# Patient Record
Sex: Female | Born: 1950
Health system: Southern US, Community
[De-identification: ages and names within clinical notes are randomized; demographics above are authoritative.]

## PROBLEM LIST (undated history)

## (undated) DIAGNOSIS — G629 Polyneuropathy, unspecified: Secondary | ICD-10-CM

## (undated) DIAGNOSIS — K219 Gastro-esophageal reflux disease without esophagitis: Secondary | ICD-10-CM

## (undated) DIAGNOSIS — I10 Essential (primary) hypertension: Secondary | ICD-10-CM

## (undated) DIAGNOSIS — M542 Cervicalgia: Secondary | ICD-10-CM

## (undated) DIAGNOSIS — E785 Hyperlipidemia, unspecified: Secondary | ICD-10-CM

## (undated) DIAGNOSIS — M1990 Unspecified osteoarthritis, unspecified site: Secondary | ICD-10-CM

## (undated) DIAGNOSIS — I341 Nonrheumatic mitral (valve) prolapse: Secondary | ICD-10-CM

## (undated) DIAGNOSIS — IMO0002 Reserved for concepts with insufficient information to code with codable children: Secondary | ICD-10-CM

## (undated) DIAGNOSIS — E041 Nontoxic single thyroid nodule: Secondary | ICD-10-CM

## (undated) HISTORY — PX: ABDOMINAL HYSTERECTOMY: SHX81

## (undated) HISTORY — PX: HAND SURGERY: SHX662

## (undated) HISTORY — PX: OTHER SURGICAL HISTORY: SHX169

---

## 1988-03-05 HISTORY — PX: LAPAROSCOPIC TOTAL HYSTERECTOMY: SUR800

## 2002-06-23 ENCOUNTER — Encounter: Payer: Self-pay | Admitting: Internal Medicine

## 2002-06-23 ENCOUNTER — Ambulatory Visit (HOSPITAL_COMMUNITY): Admission: RE | Admit: 2002-06-23 | Discharge: 2002-06-23 | Payer: Self-pay | Admitting: Internal Medicine

## 2007-08-21 ENCOUNTER — Ambulatory Visit (HOSPITAL_COMMUNITY): Payer: Self-pay | Admitting: Psychiatry

## 2007-09-04 ENCOUNTER — Ambulatory Visit (HOSPITAL_COMMUNITY): Payer: Self-pay | Admitting: Psychiatry

## 2007-09-25 ENCOUNTER — Ambulatory Visit (HOSPITAL_COMMUNITY): Payer: Self-pay | Admitting: Psychiatry

## 2007-10-08 ENCOUNTER — Ambulatory Visit (HOSPITAL_COMMUNITY): Payer: Self-pay | Admitting: Psychiatry

## 2007-10-29 ENCOUNTER — Ambulatory Visit (HOSPITAL_COMMUNITY): Payer: Self-pay | Admitting: Psychiatry

## 2007-11-05 ENCOUNTER — Ambulatory Visit (HOSPITAL_COMMUNITY): Payer: Self-pay | Admitting: Psychiatry

## 2007-11-19 ENCOUNTER — Ambulatory Visit (HOSPITAL_COMMUNITY): Payer: Self-pay | Admitting: Psychiatry

## 2007-12-11 ENCOUNTER — Ambulatory Visit (HOSPITAL_COMMUNITY): Payer: Self-pay | Admitting: Psychiatry

## 2007-12-30 ENCOUNTER — Ambulatory Visit (HOSPITAL_COMMUNITY): Payer: Self-pay | Admitting: Psychiatry

## 2008-01-20 ENCOUNTER — Ambulatory Visit (HOSPITAL_COMMUNITY): Payer: Self-pay | Admitting: Psychiatry

## 2008-02-12 ENCOUNTER — Ambulatory Visit (HOSPITAL_COMMUNITY): Payer: Self-pay | Admitting: Psychiatry

## 2008-03-02 ENCOUNTER — Ambulatory Visit (HOSPITAL_COMMUNITY): Payer: Self-pay | Admitting: Psychiatry

## 2008-05-25 ENCOUNTER — Ambulatory Visit (HOSPITAL_COMMUNITY): Payer: Self-pay | Admitting: Psychiatry

## 2011-05-30 ENCOUNTER — Encounter (INDEPENDENT_AMBULATORY_CARE_PROVIDER_SITE_OTHER): Payer: Self-pay | Admitting: *Deleted

## 2011-06-21 ENCOUNTER — Other Ambulatory Visit (INDEPENDENT_AMBULATORY_CARE_PROVIDER_SITE_OTHER): Payer: Self-pay | Admitting: *Deleted

## 2011-06-21 ENCOUNTER — Encounter (INDEPENDENT_AMBULATORY_CARE_PROVIDER_SITE_OTHER): Payer: Self-pay | Admitting: *Deleted

## 2011-06-21 ENCOUNTER — Telehealth (INDEPENDENT_AMBULATORY_CARE_PROVIDER_SITE_OTHER): Payer: Self-pay | Admitting: *Deleted

## 2011-06-21 DIAGNOSIS — Z86010 Personal history of colonic polyps: Secondary | ICD-10-CM

## 2011-06-21 NOTE — Telephone Encounter (Signed)
Patient needs movi prep 

## 2011-06-22 MED ORDER — PEG-KCL-NACL-NASULF-NA ASC-C 100 G PO SOLR: 1.0000 | Freq: Once | ORAL | Status: DC

## 2011-07-24 ENCOUNTER — Emergency Department (HOSPITAL_COMMUNITY)
Admission: EM | Admit: 2011-07-24 | Discharge: 2011-07-24 | Discharge status: Against Medical Advice | Payer: Medicare Other | Attending: Emergency Medicine | Admitting: Emergency Medicine

## 2011-07-24 ENCOUNTER — Encounter (HOSPITAL_COMMUNITY): Payer: Self-pay | Admitting: *Deleted

## 2011-07-24 ENCOUNTER — Emergency Department (HOSPITAL_COMMUNITY): Payer: Medicare Other

## 2011-07-24 DIAGNOSIS — R0602 Shortness of breath: Secondary | ICD-10-CM | POA: Insufficient documentation

## 2011-07-24 DIAGNOSIS — R079 Chest pain, unspecified: Secondary | ICD-10-CM | POA: Insufficient documentation

## 2011-07-24 DIAGNOSIS — IMO0002 Reserved for concepts with insufficient information to code with codable children: Secondary | ICD-10-CM | POA: Insufficient documentation

## 2011-07-24 DIAGNOSIS — Z87891 Personal history of nicotine dependence: Secondary | ICD-10-CM | POA: Insufficient documentation

## 2011-07-24 DIAGNOSIS — E119 Type 2 diabetes mellitus without complications: Secondary | ICD-10-CM | POA: Insufficient documentation

## 2011-07-24 DIAGNOSIS — I10 Essential (primary) hypertension: Secondary | ICD-10-CM | POA: Insufficient documentation

## 2011-07-24 HISTORY — DX: Essential (primary) hypertension: I10

## 2011-07-24 HISTORY — DX: Reserved for concepts with insufficient information to code with codable children: IMO0002

## 2011-07-24 LAB — DIFFERENTIAL
Basophils Absolute: 0 10*3/uL (ref 0.0–0.1)
Eosinophils Absolute: 0.1 10*3/uL (ref 0.0–0.7)
Lymphs Abs: 2.6 10*3/uL (ref 0.7–4.0)
Monocytes Absolute: 0.5 10*3/uL (ref 0.1–1.0)
Neutrophils Relative %: 61 % (ref 43–77)

## 2011-07-24 LAB — POCT I-STAT, CHEM 8
Calcium, Ion: 1.25 mmol/L (ref 1.12–1.32)
HCT: 44 % (ref 36.0–46.0)
Hemoglobin: 15 g/dL (ref 12.0–15.0)
Sodium: 143 mEq/L (ref 135–145)
TCO2: 28 mmol/L (ref 0–100)

## 2011-07-24 LAB — POCT I-STAT TROPONIN I: Troponin i, poc: 0.01 ng/mL (ref 0.00–0.08)

## 2011-07-24 LAB — CBC
MCH: 27.9 pg (ref 26.0–34.0)
MCV: 82.3 fL (ref 78.0–100.0)
Platelets: 274 10*3/uL (ref 150–400)
RDW: 14.6 % (ref 11.5–15.5)

## 2011-07-24 MED ORDER — ASPIRIN 325 MG PO TABS
325.0000 mg | ORAL_TABLET | ORAL | Status: AC
  Administered 2011-07-24: 325 mg via ORAL
  Filled 2011-07-24: qty 1

## 2011-07-24 MED ORDER — NITROGLYCERIN 0.4 MG SL SUBL
0.4000 mg | SUBLINGUAL_TABLET | SUBLINGUAL | Status: DC | PRN
  Administered 2011-07-24 (×2): 0.4 mg via SUBLINGUAL
  Filled 2011-07-24: qty 25

## 2011-07-24 MED ORDER — NITROGLYCERIN 0.3 MG SL SUBL: 0.3000 mg | SUBLINGUAL_TABLET | SUBLINGUAL | Status: DC | PRN

## 2011-07-24 MED ORDER — NITROGLYCERIN 0.4 MG SL SUBL: 0.4000 mg | SUBLINGUAL_TABLET | SUBLINGUAL | Status: DC | PRN

## 2011-07-24 NOTE — Discharge Instructions (Signed)
Chest Pain (Nonspecific) It is often hard to give a specific diagnosis for the cause of chest pain. There is always a chance that your pain could be related to something serious, such as a heart attack or a blood clot in the lungs. You need to follow up with your caregiver for further evaluation. CAUSES   Heartburn.   Pneumonia or bronchitis.   Anxiety or stress.   Inflammation around your heart (pericarditis) or lung (pleuritis or pleurisy).   A blood clot in the lung.   A collapsed lung (pneumothorax). It can develop suddenly on its own (spontaneous pneumothorax) or from injury (trauma) to the chest.   Shingles infection (herpes zoster virus).  The chest wall is composed of bones, muscles, and cartilage. Any of these can be the source of the pain.  The bones can be bruised by injury.   The muscles or cartilage can be strained by coughing or overwork.   The cartilage can be affected by inflammation and become sore (costochondritis).  DIAGNOSIS  Lab tests or other studies, such as X-rays, electrocardiography, stress testing, or cardiac imaging, may be needed to find the cause of your pain.  TREATMENT   Treatment depends on what may be causing your chest pain. Treatment may include:   Acid blockers for heartburn.   Anti-inflammatory medicine.   Pain medicine for inflammatory conditions.   Antibiotics if an infection is present.   You may be advised to change lifestyle habits. This includes stopping smoking and avoiding alcohol, caffeine, and chocolate.   You may be advised to keep your head raised (elevated) when sleeping. This reduces the chance of acid going backward from your stomach into your esophagus.   Most of the time, nonspecific chest pain will improve within 2 to 3 days with rest and mild pain medicine.  HOME CARE INSTRUCTIONS   If antibiotics were prescribed, take your antibiotics as directed. Finish them even if you start to feel better.   For the next few  days, avoid physical activities that bring on chest pain. Continue physical activities as directed.   Do not smoke.   Avoid drinking alcohol.   Only take over-the-counter or prescription medicine for pain, discomfort, or fever as directed by your caregiver.   Follow your caregiver's suggestions for further testing if your chest pain does not go away.   Keep any follow-up appointments you made. If you do not go to an appointment, you could develop lasting (chronic) problems with pain. If there is any problem keeping an appointment, you must call to reschedule.  SEEK MEDICAL CARE IF:   You think you are having problems from the medicine you are taking. Read your medicine instructions carefully.   Your chest pain does not go away, even after treatment.   You develop a rash with blisters on your chest.  SEEK IMMEDIATE MEDICAL CARE IF:   You have increased chest pain or pain that spreads to your arm, neck, jaw, back, or abdomen.   You develop shortness of breath, an increasing cough, or you are coughing up blood.   You have severe back or abdominal pain, feel nauseous, or vomit.   You develop severe weakness, fainting, or chills.   You have a fever.  THIS IS AN EMERGENCY. Do not wait to see if the pain will go away. Get medical help at once. Call your local emergency services (911 in [place] Do not drive yourself to the hospital. MAKE SURE YOU:   Understand these instructions.     Will watch your condition.   Will get help right away if you are not doing well or get worse.  Document Released: 11/29/2004 Document Revised: 02/08/2011 Document Reviewed: 09/25/2007 [employer] Patient Information 2012 [employer]. 

## 2011-07-24 NOTE — ED Notes (Signed)
Pt left care area prior to receiving discharge instructions.

## 2011-07-24 NOTE — ED Notes (Signed)
Pt is here with left neck and radiated down to left chest and it started when they were trying to find out how to get in here with an appointment for her husband

## 2011-07-24 NOTE — ED Provider Notes (Signed)
History     CSN: [id number]  Arrival date & time 07/24/11  1429   First MD Initiated Contact with Patient 07/24/11 1551      Chief Complaint  Patient presents with  . Chest Pain    (Consider location/radiation/quality/duration/timing/severity/associated sxs/prior treatment) Patient is a 61 y.o. female presenting with chest pain. The history is provided by the patient.  Chest Pain The chest pain began 1 - 2 hours ago. Chest pain occurs constantly. The chest pain is unchanged. The pain is associated with breathing. At its most intense, the pain is at 7/10. The pain is currently at 7/10. The severity of the pain is moderate. The quality of the pain is described as squeezing. The pain radiates to the left neck and right neck. Chest pain is worsened by deep breathing. Primary symptoms include shortness of breath (mild). Pertinent negatives for primary symptoms include no fever, no fatigue, no syncope, no cough, no palpitations, no abdominal pain, no nausea, no vomiting and no dizziness.     Past Medical History  Diagnosis Date  . Diabetes mellitus   . Degenerative disk disease   . Hypertension     Past Surgical History  Procedure Date  . Abdominal hysterectomy     No family history on file.  History  Substance Use Topics  . Smoking status: Former Games developer  . Smokeless tobacco: Not on file  . Alcohol Use: No    OB History    Grav Para Term Preterm Abortions TAB SAB Ect Mult Living                  Review of Systems  Constitutional: Negative for fever and fatigue.  HENT: Negative for congestion, drooling and neck pain.   Eyes: Negative for pain.  Respiratory: Positive for shortness of breath (mild). Negative for cough.   Cardiovascular: Positive for chest pain. Negative for palpitations and syncope.  Gastrointestinal: Negative for nausea, vomiting, abdominal pain and diarrhea.  Genitourinary: Negative for dysuria and hematuria.  Musculoskeletal: Negative for back pain  and gait problem.  Skin: Negative for color change.  Neurological: Negative for dizziness and headaches.  Hematological: Negative for adenopathy.  Psychiatric/Behavioral: Negative for behavioral problems.  All other systems reviewed and are negative.    Allergies  Review of patient's allergies indicates no known allergies.  Home Medications   Current Outpatient Rx  Name Route Sig Dispense Refill  . VITAMIN C 1000 MG PO TABS Oral Take 1,000 mg by mouth daily.    Marland Kitchen DICLOFENAC SODIUM 75 MG PO TBEC Oral Take 75 mg by mouth 2 (two) times daily.    Marland Kitchen GABAPENTIN 300 MG PO CAPS Oral Take 600 mg by mouth 2 (two) times daily.    Marland Kitchen GLUCOSAMINE-CHONDROITIN 500-400 MG PO TABS Oral Take 2 tablets by mouth daily.    . GLYBURIDE-METFORMIN 5-500 MG PO TABS Oral Take 2 tablets by mouth daily with breakfast.    . GLYBURIDE-METFORMIN 5-500 MG PO TABS Oral Take 1 tablet by mouth at bedtime.    Marland Kitchen LEVEMIR FLEXPEN [place] Subcutaneous Inject 14 Units into the skin at bedtime.    Marland Kitchen MISOPROSTOL 200 MCG PO TABS Oral Take 200 mcg by mouth 2 (two) times daily. Take with diclofenac    . FISH OIL 1200 MG PO CAPS Oral Take 1 capsule by mouth daily.    Marland Kitchen OVER THE COUNTER MEDICATION Oral Take 1-2 tablets by mouth daily. Unknown OTC med- "nitrous oxide something" to clear the arteries    .  RAMIPRIL 5 MG PO CAPS Oral Take 5 mg by mouth daily.    Marland Kitchen PEG-KCL-NACL-NASULF-NA ASC-C 100 G PO SOLR Oral Take 1 kit (100 g total) by mouth once. 1 kit 0    BP 166/95  Pulse 71  Temp(Src) 97.9 F (36.6 C) (Oral)  Resp 14  SpO2 100%  Physical Exam  Constitutional: She is oriented to person, place, and time. She appears well-developed and well-nourished.  HENT:  Head: Normocephalic.  Mouth/Throat: No oropharyngeal exudate.  Eyes: Conjunctivae and EOM are normal. Pupils are equal, round, and reactive to light.  Neck: Normal range of motion. Neck supple.       No bruits heard on auscultation.  Cardiovascular: Normal rate, regular  rhythm, normal heart sounds and intact distal pulses.  Exam reveals no gallop and no friction rub.   No murmur heard. Pulmonary/Chest: Effort normal and breath sounds normal. No respiratory distress. She has no wheezes.  Abdominal: Soft. Bowel sounds are normal. There is no tenderness.  Musculoskeletal: Normal range of motion. She exhibits no edema and no tenderness.  Neurological: She is alert and oriented to person, place, and time.  Skin: Skin is warm and dry.  Psychiatric: She has a normal mood and affect. Her behavior is normal.    ED Course  Procedures (including critical care time)   Labs Reviewed  CBC  DIFFERENTIAL  POCT I-STAT, CHEM 8  POCT I-STAT TROPONIN I   Dg Chest 2 View  07/24/2011  *RADIOLOGY REPORT*  Clinical Data: Chest pain  CHEST - 2 VIEW  Comparison: None.  Findings: Normal heart size.  Clear lungs.  Degenerative changes in the thoracic spine with dextroscoliosis of the lumbar spine. No vertebral compression deformity.  Bronchitic changes have a chronic appearance.  No pneumothorax and no pleural effusion.  IMPRESSION: No active cardiopulmonary disease.  Original Report Authenticated By: Donavan Burnet, M.D.     No diagnosis found.   Date: 07/24/2011  Rate: 71  Rhythm: normal sinus rhythm  QRS Axis: normal  Intervals: normal  ST/T Wave abnormalities: T wave inversions in V1, V2, aVR  Conduction Disutrbances:none  Narrative Interpretation: Inverted T waves in V1, V2, aVR concerning for ischemia  Old EKG Reviewed: none available    MDM  3:59 PM 61 y.o. female w hx of DM, HTN, HLP pw central cp radiating to neck that began while sitting in a car at approx 2 pm today. CP has persisted, now 7/10. Pt notes pain cw pharmacologic stress test done at [facility] approx 5 years ago. Pt AFVSS here, lab work thus far non-contributory. Wells negative. Ecg concerning for ischemia.    5:09 PM: Pt continues to appear well on exam. She denies chest pain, but is anxious to  leave to be with her husband who is meeting with a consultant here at the hospital for his own medical issues. I notified her that the Attending physician and I both recommend that she stay and get admitted to the hospital to finish her workup and r/o NSTEMI. She would like to leave AMA. She is of sound mind and has normal decision making capability.  I have discussed the diagnosis/risks/treatment options with the patient and will recommend follow-up with pcp tomorrow to discuss stress testing since she is not willing to stay and finish her workup. We also discussed returning to the ED immediately if new or worsening sx occur. We discussed the sx which are most concerning (e.g., worsening or continued cp or sob, palpitations, diaphoresis) that necessitate  immediate return. Any new prescriptions provided to the patient are listed below.  New Prescriptions   NITROGLYCERIN (NITROSTAT) 0.4 MG SL TABLET    Place 1 tablet (0.4 mg total) under the tongue every 5 (five) minutes as needed for chest pain.    Clinical Impression 1. Chest pain        Purvis Sheffield, MD 07/24/11 2011

## 2011-07-27 NOTE — ED Provider Notes (Signed)
I saw and evaluated the patient, reviewed the resident's note and I agree with the findings and plan.     Nelia Shi, MD 07/27/11 1038

## 2011-08-14 ENCOUNTER — Telehealth (INDEPENDENT_AMBULATORY_CARE_PROVIDER_SITE_OTHER): Payer: Self-pay | Admitting: *Deleted

## 2011-08-14 NOTE — Telephone Encounter (Addendum)
PCP/Requesting MD: vyas   Name & DOB: Michelle Case [birth year]     Procedure: tcs/egd  Reason/Indication:  Hx polyps, acid reflux  Has patient had this procedure before?  yes  If so, when, by whom and where?  3 yrs ago ([facility])  Is there a family history of colon cancer?  no  Who?  What age when diagnosed?    Is patient diabetic?   yes      Does patient have prosthetic heart valve?  no  Do you have a pacemaker?  no  Has patient had joint replacement within last 12 months?  no  Is patient on Coumadin, Plavix and/or Aspirin? no  Medications: see EPIC  Allergies: nkda  Medication Adjustment: 1/2 glyburide/metformin day before -- per Terri  Procedure date & time: 09/05/11 at 730

## 2011-08-17 NOTE — Telephone Encounter (Signed)
agree

## 2011-08-23 ENCOUNTER — Encounter (INDEPENDENT_AMBULATORY_CARE_PROVIDER_SITE_OTHER): Payer: Self-pay

## 2011-08-27 ENCOUNTER — Encounter (HOSPITAL_COMMUNITY): Payer: Self-pay | Admitting: Pharmacy Technician

## 2011-08-27 ENCOUNTER — Other Ambulatory Visit (INDEPENDENT_AMBULATORY_CARE_PROVIDER_SITE_OTHER): Payer: Self-pay | Admitting: *Deleted

## 2011-08-27 DIAGNOSIS — K219 Gastro-esophageal reflux disease without esophagitis: Secondary | ICD-10-CM

## 2011-08-27 DIAGNOSIS — Z86010 Personal history of colonic polyps: Secondary | ICD-10-CM

## 2011-09-05 ENCOUNTER — Ambulatory Visit (HOSPITAL_COMMUNITY): Admission: RE | Admit: 2011-09-05 | Payer: Medicare Other | Source: Ambulatory Visit | Admitting: Internal Medicine

## 2011-09-05 ENCOUNTER — Encounter (HOSPITAL_COMMUNITY)
Admission: RE | Disposition: A | Discharge status: Home / Self Care | Payer: Self-pay | Source: Ambulatory Visit | Attending: Internal Medicine

## 2011-09-05 ENCOUNTER — Ambulatory Visit (HOSPITAL_COMMUNITY)
Admission: RE | Admit: 2011-09-05 | Discharge: 2011-09-05 | Disposition: A | Discharge status: Home / Self Care | Payer: Medicare Other | Source: Ambulatory Visit | Attending: Internal Medicine | Admitting: Internal Medicine

## 2011-09-05 ENCOUNTER — Encounter (HOSPITAL_COMMUNITY): Payer: Self-pay | Admitting: *Deleted

## 2011-09-05 ENCOUNTER — Encounter (HOSPITAL_COMMUNITY): Admission: RE | Payer: Self-pay | Source: Ambulatory Visit

## 2011-09-05 DIAGNOSIS — K319 Disease of stomach and duodenum, unspecified: Secondary | ICD-10-CM | POA: Insufficient documentation

## 2011-09-05 DIAGNOSIS — K219 Gastro-esophageal reflux disease without esophagitis: Secondary | ICD-10-CM

## 2011-09-05 DIAGNOSIS — K5730 Diverticulosis of large intestine without perforation or abscess without bleeding: Secondary | ICD-10-CM

## 2011-09-05 DIAGNOSIS — I10 Essential (primary) hypertension: Secondary | ICD-10-CM | POA: Insufficient documentation

## 2011-09-05 DIAGNOSIS — K2970 Gastritis, unspecified, without bleeding: Secondary | ICD-10-CM

## 2011-09-05 DIAGNOSIS — Z794 Long term (current) use of insulin: Secondary | ICD-10-CM | POA: Insufficient documentation

## 2011-09-05 DIAGNOSIS — R634 Abnormal weight loss: Secondary | ICD-10-CM | POA: Insufficient documentation

## 2011-09-05 DIAGNOSIS — Z01812 Encounter for preprocedural laboratory examination: Secondary | ICD-10-CM | POA: Insufficient documentation

## 2011-09-05 DIAGNOSIS — Z86010 Personal history of colonic polyps: Secondary | ICD-10-CM

## 2011-09-05 DIAGNOSIS — K2940 Chronic atrophic gastritis without bleeding: Secondary | ICD-10-CM | POA: Insufficient documentation

## 2011-09-05 DIAGNOSIS — K2990 Gastroduodenitis, unspecified, without bleeding: Secondary | ICD-10-CM

## 2011-09-05 DIAGNOSIS — E119 Type 2 diabetes mellitus without complications: Secondary | ICD-10-CM | POA: Insufficient documentation

## 2011-09-05 DIAGNOSIS — D126 Benign neoplasm of colon, unspecified: Secondary | ICD-10-CM | POA: Insufficient documentation

## 2011-09-05 DIAGNOSIS — Z79899 Other long term (current) drug therapy: Secondary | ICD-10-CM | POA: Insufficient documentation

## 2011-09-05 DIAGNOSIS — D131 Benign neoplasm of stomach: Secondary | ICD-10-CM

## 2011-09-05 HISTORY — DX: Unspecified osteoarthritis, unspecified site: M19.90

## 2011-09-05 HISTORY — DX: Gastro-esophageal reflux disease without esophagitis: K21.9

## 2011-09-05 HISTORY — DX: Polyneuropathy, unspecified: G62.9

## 2011-09-05 SURGERY — COLONOSCOPY
Anesthesia: Moderate Sedation

## 2011-09-05 SURGERY — COLONOSCOPY WITH ESOPHAGOGASTRODUODENOSCOPY (EGD)
Anesthesia: Moderate Sedation

## 2011-09-05 MED ORDER — BUTAMBEN-TETRACAINE-BENZOCAINE 2-2-14 % EX AERO
INHALATION_SPRAY | CUTANEOUS | Status: DC | PRN
  Administered 2011-09-05: 2 via TOPICAL

## 2011-09-05 MED ORDER — STERILE WATER FOR IRRIGATION IR SOLN
Status: DC | PRN
  Administered 2011-09-05: 15:00:00

## 2011-09-05 MED ORDER — MIDAZOLAM HCL 5 MG/5ML IJ SOLN
INTRAMUSCULAR | Status: AC
  Filled 2011-09-05: qty 10

## 2011-09-05 MED ORDER — MEPERIDINE HCL 50 MG/ML IJ SOLN
INTRAMUSCULAR | Status: AC
  Filled 2011-09-05: qty 1

## 2011-09-05 MED ORDER — MIDAZOLAM HCL 5 MG/5ML IJ SOLN
INTRAMUSCULAR | Status: DC | PRN
  Administered 2011-09-05 (×4): 2 mg via INTRAVENOUS

## 2011-09-05 MED ORDER — MEPERIDINE HCL 50 MG/ML IJ SOLN
INTRAMUSCULAR | Status: DC | PRN
  Administered 2011-09-05 (×2): 25 mg via INTRAVENOUS

## 2011-09-05 MED ORDER — SODIUM CHLORIDE 0.45 % IV SOLN
Freq: Once | INTRAVENOUS | Status: AC
  Administered 2011-09-05: 1000 mL via INTRAVENOUS

## 2011-09-05 MED ORDER — OMEPRAZOLE 20 MG PO CPDR: 20.0000 mg | DELAYED_RELEASE_CAPSULE | Freq: Every day | ORAL | Status: DC

## 2011-09-05 NOTE — H&P (Signed)
Michelle Case is an 61 y.o. female.   Chief Complaint: Patient is here for esophagogastroduodenoscopy and colonoscopy. HPI: Patient is 61 year old African female was history of colonic polyps. She had polyp removed high-grade dysplasia and 80s and has had periodic exams. Her last exam was 3 years ago. She denies rectal bleeding. She complains of frequent regurgitation chest and throat pain. She also has lost 30 pounds. He has been on NSAIDs for chronic low back pain. She is therefore also undergoing diagnostic EGD followed by surveillance colonoscopy.  Past Medical History  Diagnosis Date  . Diabetes mellitus   . Degenerative disk disease   . Hypertension   . GERD (gastroesophageal reflux disease)   . Neuropathy   . Degenerative joint disease     Past Surgical History  Procedure Date  . Abdominal hysterectomy   . Colonscopy     polyp- cancer in situ  . Hand surgery     right hand    Family History  Problem Relation Age of Onset  . Stroke Mother   . Stroke Father   . Stroke Sister   . Stroke Brother   . Heart disease Brother    Social History:  reports that she has quit smoking. She does not have any smokeless tobacco history on file. She reports that she does not drink alcohol or use illicit drugs.  Allergies: No Known Allergies  Medications Prior to Admission  Medication Sig Dispense Refill  . Ascorbic Acid (VITAMIN C) 1000 MG tablet Take 1,000 mg by mouth 2 (two) times daily.       . diclofenac (VOLTAREN) 75 MG EC tablet Take 75 mg by mouth 2 (two) times daily.      Marland Kitchen gabapentin (NEURONTIN) 300 MG capsule Take 300 mg by mouth 4 (four) times daily.       Marland Kitchen glucosamine-chondroitin 500-400 MG tablet Take 2 tablets by mouth daily.      Marland Kitchen glyBURIDE-metformin (GLUCOVANCE) 5-500 MG per tablet Take 1-2 tablets by mouth 2 (two) times daily with a meal.       . Insulin Detemir (LEVEMIR FLEXPEN [place]) Inject 14 Units into the skin at bedtime.      Marland Kitchen OVER THE COUNTER MEDICATION  Take 1-2 tablets by mouth daily. Unknown OTC med- "nitrous oxide something" to clear the arteries      . peg 3350 powder (MOVIPREP) SOLR Take 1 kit (100 g total) by mouth once.  1 kit  0  . ramipril (ALTACE) 5 MG capsule Take 5 mg by mouth daily.      . misoprostol (CYTOTEC) 200 MCG tablet Take 200 mcg by mouth 2 (two) times daily. Take with diclofenac        Results for orders placed during the hospital encounter of 09/05/11 (from the past 48 hour(s))  GLUCOSE, CAPILLARY     Status: Abnormal   Collection Time   09/05/11  2:01 PM      Component Value Range Comment   Glucose-Capillary 132 (*) 70 - 99 mg/dL    No results found.  ROS  Blood pressure 144/85, pulse 83, temperature 98 F (36.7 C), temperature source Oral, resp. rate 18, height 5' (1.524 m), weight 168 lb (76.204 kg), SpO2 96.00%. Physical Exam  Constitutional: She appears well-developed and well-nourished.  HENT:  Mouth/Throat: Oropharynx is clear and moist. No oropharyngeal exudate.  Eyes: Conjunctivae are normal. No scleral icterus.  Neck: No thyromegaly present.  Cardiovascular: Normal rate, regular rhythm and normal heart sounds.   No murmur  heard. Respiratory: Effort normal and breath sounds normal.  GI: Soft. She exhibits no distension and no mass. There is no tenderness.  Musculoskeletal: She exhibits no edema.  Lymphadenopathy:    She has no cervical adenopathy.  Neurological: She is alert.  Skin: Skin is warm and dry.     Assessment/Plan Weight loss. Onset of GERD symptoms. History of colonic polyps. EGD and colonoscopy.  REHMAN,NAJEEB U 09/05/2011, 3:22 PM

## 2011-09-05 NOTE — Op Note (Signed)
EGD AND COLONOSCOPY PROCEDURE REPORT  PATIENT:  Michelle Case  MR#:  [id number] Birthdate:  09-Jul-1950, 61 y.o., female Endoscopist:  Dr. Malissa Hippo, MD Referred By:  Dr. Ignatius Specking, MD Procedure Date: 09/05/2011  Procedure:   EGD & Colonoscopy.  Indications:  Patient is 61 year old African female with new onset of GERD as well as 30 pound weight loss. Vision has been on NSAID for chronic low back pain. She is undergoing diagnostic gastroduodenoscopy followed by surveillance colonoscopy as she has history of colonic polyps.            Informed Consent:  The risks, benefits, alternatives & imponderables which include, but are not limited to, bleeding, infection, perforation, drug reaction and potential missed lesion have been reviewed.  The potential for biopsy, lesion removal, esophageal dilation, etc. have also been discussed.  Questions have been answered.  All parties agreeable.  Please see history & physical in medical record for more information.  Medications:  Demerol 50 mg IV Versed 8 mg IV Cetacaine spray topically for oropharyngeal anesthesia  EGD  Description of procedure:  The endoscope was introduced through the mouth and advanced to the second portion of the duodenum without difficulty or limitations. The mucosal surfaces were surveyed very carefully during advancement of the scope and upon withdrawal.  Findings:  Esophagus:  Mucosa of the esophagus was normal. Focal erythema noted at GE junction. GEJ:  37  cm Stomach:  Stomach was empty and distended very well with insufflation. Folds in the proximal stomach were normal. Examination mucosa at body was normal. There were few focal areas of erythema at antrum along with single erosion. There was erythema to pyloric channel but no ulcer was noted. Angularis fundus and cardia were examined by retroflexing the scope and were normal. Duodenum:  Cluster of duodenal polyps at proximal bulb. On of these was over 10 mm in  diameter. Lab see taken for routine histology. Post bulbar mucosa was normal.  Therapeutic/Diagnostic Maneuvers Performed:  See above  COLONOSCOPY Description of procedure:  After a digital rectal exam was performed, that colonoscope was advanced from the anus through the rectum and colon to the area of the cecum, ileocecal valve and appendiceal orifice. The cecum was deeply intubated. These structures were well-seen and photographed for the record. From the level of the cecum and ileocecal valve, the scope was slowly and cautiously withdrawn. The mucosal surfaces were carefully surveyed utilizing scope tip to flexion to facilitate fold flattening as needed. The scope was pulled down into the rectum where a thorough exam including retroflexion was performed.  Findings:   Prep excellent. Few scattered diverticula throughout the colon. Three small polyps at transverse colon ablated via cold biopsy and submitted in one container. Normal rectal mucosa and anal rectal junction.  Therapeutic/Diagnostic Maneuvers Performed:  see above  Complications:  None  Cecal Withdrawal Time:  13 minutes  Impression:  Mild changes of reflux esophagitis limited to GE junction. Erosive antral gastritis along with a loaded channel inflammation. Cluster of polyps at proximal duodenal bulb possibly due to hypertrophic Brunner's glands. Biopsy taken for histology. Pancolonic diverticulosis. Three small polyps ablated via cold biopsy from transverse colon and submitted in one container.  Recommendations:  Continue anti-reflux measures and omeprazole. Use NSAIDs on as-needed basis. H. pylori serology. I will contact patient with results of biopsy and further recommendations.  Michelle Case  09/05/2011 4:13 PM  CC: Dr. Ignatius Specking., MD & Dr. Bonnetta Barry ref. provider found

## 2011-09-13 ENCOUNTER — Encounter (INDEPENDENT_AMBULATORY_CARE_PROVIDER_SITE_OTHER): Payer: Self-pay | Admitting: *Deleted

## 2011-12-31 ENCOUNTER — Other Ambulatory Visit: Payer: Self-pay | Admitting: Neurological Surgery

## 2012-01-07 ENCOUNTER — Encounter (HOSPITAL_COMMUNITY): Payer: Self-pay

## 2012-01-07 MED ORDER — CEFAZOLIN SODIUM-DEXTROSE 2-3 GM-% IV SOLR
2.0000 g | INTRAVENOUS | Status: AC
  Administered 2012-01-08: 2 g via INTRAVENOUS
  Filled 2012-01-07: qty 50

## 2012-01-08 ENCOUNTER — Encounter (HOSPITAL_COMMUNITY)
Admission: RE | Disposition: A | Discharge status: Home Health | Payer: Self-pay | Source: Ambulatory Visit | Attending: Neurological Surgery

## 2012-01-08 ENCOUNTER — Encounter (HOSPITAL_COMMUNITY): Payer: Self-pay | Admitting: Anesthesiology

## 2012-01-08 ENCOUNTER — Inpatient Hospital Stay (HOSPITAL_COMMUNITY): Payer: Medicare Other | Admitting: Anesthesiology

## 2012-01-08 ENCOUNTER — Inpatient Hospital Stay (HOSPITAL_COMMUNITY): Payer: Medicare Other

## 2012-01-08 ENCOUNTER — Encounter (HOSPITAL_COMMUNITY): Payer: Self-pay | Admitting: Surgery

## 2012-01-08 ENCOUNTER — Inpatient Hospital Stay (HOSPITAL_COMMUNITY)
Admission: RE | Admit: 2012-01-08 | Discharge: 2012-01-10 | DRG: 473 | Disposition: A | Discharge status: Home Health | Payer: Medicare Other | Source: Ambulatory Visit | Attending: Neurological Surgery | Admitting: Neurological Surgery

## 2012-01-08 DIAGNOSIS — E669 Obesity, unspecified: Secondary | ICD-10-CM | POA: Diagnosis present

## 2012-01-08 DIAGNOSIS — Z794 Long term (current) use of insulin: Secondary | ICD-10-CM

## 2012-01-08 DIAGNOSIS — M4310 Spondylolisthesis, site unspecified: Secondary | ICD-10-CM | POA: Diagnosis present

## 2012-01-08 DIAGNOSIS — E119 Type 2 diabetes mellitus without complications: Secondary | ICD-10-CM | POA: Diagnosis present

## 2012-01-08 DIAGNOSIS — I10 Essential (primary) hypertension: Secondary | ICD-10-CM | POA: Diagnosis present

## 2012-01-08 DIAGNOSIS — M488X2 Other specified spondylopathies, cervical region: Secondary | ICD-10-CM | POA: Diagnosis present

## 2012-01-08 DIAGNOSIS — Z79899 Other long term (current) drug therapy: Secondary | ICD-10-CM

## 2012-01-08 DIAGNOSIS — M4712 Other spondylosis with myelopathy, cervical region: Principal | ICD-10-CM | POA: Diagnosis present

## 2012-01-08 DIAGNOSIS — M5000 Cervical disc disorder with myelopathy, unspecified cervical region: Secondary | ICD-10-CM

## 2012-01-08 HISTORY — DX: Cervicalgia: M54.2

## 2012-01-08 HISTORY — DX: Hyperlipidemia, unspecified: E78.5

## 2012-01-08 HISTORY — DX: Nontoxic single thyroid nodule: E04.1

## 2012-01-08 HISTORY — DX: Nonrheumatic mitral (valve) prolapse: I34.1

## 2012-01-08 HISTORY — PX: ANTERIOR CERVICAL CORPECTOMY: SHX1159

## 2012-01-08 LAB — GLUCOSE, CAPILLARY
Glucose-Capillary: 106 mg/dL — ABNORMAL HIGH (ref 70–99)
Glucose-Capillary: 204 mg/dL — ABNORMAL HIGH (ref 70–99)

## 2012-01-08 LAB — BASIC METABOLIC PANEL
BUN: 12 mg/dL (ref 6–23)
CO2: 26 mEq/L (ref 19–32)
Chloride: 100 mEq/L (ref 96–112)
Creatinine, Ser: 0.6 mg/dL (ref 0.50–1.10)
GFR calc Af Amer: >90 mL/min (ref >90)
Glucose, Bld: 119 mg/dL — ABNORMAL HIGH (ref 70–99)
Potassium: 4.1 mEq/L (ref 3.5–5.1)

## 2012-01-08 LAB — CBC
HCT: 39.5 % (ref 36.0–46.0)
MCV: 82.6 fL (ref 78.0–100.0)
RBC: 4.78 MIL/uL (ref 3.87–5.11)
RDW: 14.8 % (ref 11.5–15.5)
WBC: 8.5 10*3/uL (ref 4.0–10.5)

## 2012-01-08 SURGERY — ANTERIOR CERVICAL CORPECTOMY
Anesthesia: General | Site: Neck | Wound class: Clean

## 2012-01-08 MED ORDER — ACETAMINOPHEN 10 MG/ML IV SOLN
INTRAVENOUS | Status: AC
  Filled 2012-01-08: qty 100

## 2012-01-08 MED ORDER — NEOSTIGMINE METHYLSULFATE 1 MG/ML IJ SOLN
INTRAMUSCULAR | Status: DC | PRN
  Administered 2012-01-08: 3 mg via INTRAVENOUS

## 2012-01-08 MED ORDER — SODIUM CHLORIDE 0.9 % IV SOLN: 250.0000 mL | INTRAVENOUS | Status: DC

## 2012-01-08 MED ORDER — RAMIPRIL 5 MG PO CAPS
5.0000 mg | ORAL_CAPSULE | Freq: Every day | ORAL | Status: DC
  Administered 2012-01-09 – 2012-01-10 (×2): 5 mg via ORAL
  Filled 2012-01-08 (×2): qty 1

## 2012-01-08 MED ORDER — THROMBIN 20000 UNITS EX SOLR
CUTANEOUS | Status: DC | PRN
  Administered 2012-01-08: 10:00:00 via TOPICAL

## 2012-01-08 MED ORDER — DIAZEPAM 5 MG PO TABS
5.0000 mg | ORAL_TABLET | Freq: Four times a day (QID) | ORAL | Status: DC | PRN
  Administered 2012-01-09 – 2012-01-10 (×2): 5 mg via ORAL
  Filled 2012-01-08 (×2): qty 1

## 2012-01-08 MED ORDER — GABAPENTIN 300 MG PO CAPS
300.0000 mg | ORAL_CAPSULE | Freq: Four times a day (QID) | ORAL | Status: DC
  Administered 2012-01-08 – 2012-01-10 (×6): 300 mg via ORAL
  Filled 2012-01-08 (×11): qty 1

## 2012-01-08 MED ORDER — GLYBURIDE 5 MG PO TABS
10.0000 mg | ORAL_TABLET | Freq: Every day | ORAL | Status: DC
  Administered 2012-01-08 – 2012-01-09 (×2): 10 mg via ORAL
  Filled 2012-01-08 (×3): qty 2

## 2012-01-08 MED ORDER — MORPHINE SULFATE 2 MG/ML IJ SOLN
1.0000 mg | INTRAMUSCULAR | Status: DC | PRN
Start: 2012-01-08 — End: 2012-01-10
  Administered 2012-01-08: 2 mg via INTRAVENOUS
  Filled 2012-01-08: qty 1

## 2012-01-08 MED ORDER — GLYBURIDE-METFORMIN 5-500 MG PO TABS: 1.0000 | ORAL_TABLET | Freq: Two times a day (BID) | ORAL | Status: DC

## 2012-01-08 MED ORDER — HEMOSTATIC AGENTS (NO CHARGE) OPTIME
TOPICAL | Status: DC | PRN
  Administered 2012-01-08: 1 via TOPICAL

## 2012-01-08 MED ORDER — MISOPROSTOL 200 MCG PO TABS
200.0000 ug | ORAL_TABLET | Freq: Two times a day (BID) | ORAL | Status: DC
  Administered 2012-01-09: 200 ug via ORAL
  Filled 2012-01-08 (×5): qty 1

## 2012-01-08 MED ORDER — ONDANSETRON HCL 4 MG/2ML IJ SOLN
INTRAMUSCULAR | Status: DC | PRN
  Administered 2012-01-08: 4 mg via INTRAVENOUS

## 2012-01-08 MED ORDER — METFORMIN HCL 500 MG PO TABS
500.0000 mg | ORAL_TABLET | Freq: Every day | ORAL | Status: DC
  Administered 2012-01-09 – 2012-01-10 (×2): 500 mg via ORAL
  Filled 2012-01-08 (×3): qty 1

## 2012-01-08 MED ORDER — PHENYLEPHRINE HCL 10 MG/ML IJ SOLN
INTRAMUSCULAR | Status: DC | PRN
  Administered 2012-01-08 (×2): 80 ug via INTRAVENOUS

## 2012-01-08 MED ORDER — SODIUM CHLORIDE 0.9 % IV SOLN
INTRAVENOUS | Status: AC
  Filled 2012-01-08: qty 500

## 2012-01-08 MED ORDER — LIDOCAINE-EPINEPHRINE 1 %-1:100000 IJ SOLN
INTRAMUSCULAR | Status: DC | PRN
  Administered 2012-01-08: 6 mL

## 2012-01-08 MED ORDER — OXYCODONE-ACETAMINOPHEN 5-325 MG PO TABS
1.0000 | ORAL_TABLET | ORAL | Status: DC | PRN
  Administered 2012-01-09 – 2012-01-10 (×2): 1 via ORAL
  Filled 2012-01-08 (×2): qty 1

## 2012-01-08 MED ORDER — CEFAZOLIN SODIUM 1-5 GM-% IV SOLN
1.0000 g | Freq: Three times a day (TID) | INTRAVENOUS | Status: AC
  Administered 2012-01-08 – 2012-01-09 (×2): 1 g via INTRAVENOUS
  Filled 2012-01-08 (×2): qty 50

## 2012-01-08 MED ORDER — PANTOPRAZOLE SODIUM 40 MG PO TBEC
40.0000 mg | DELAYED_RELEASE_TABLET | Freq: Every day | ORAL | Status: DC
  Filled 2012-01-08: qty 1

## 2012-01-08 MED ORDER — PROPOFOL 10 MG/ML IV BOLUS
INTRAVENOUS | Status: DC | PRN
  Administered 2012-01-08: 130 mg via INTRAVENOUS

## 2012-01-08 MED ORDER — GLYBURIDE 5 MG PO TABS
5.0000 mg | ORAL_TABLET | Freq: Every day | ORAL | Status: DC
  Administered 2012-01-09 – 2012-01-10 (×2): 5 mg via ORAL
  Filled 2012-01-08 (×3): qty 1

## 2012-01-08 MED ORDER — BACITRACIN 50000 UNITS IM SOLR
INTRAMUSCULAR | Status: AC
  Filled 2012-01-08: qty 1

## 2012-01-08 MED ORDER — ALUM & MAG HYDROXIDE-SIMETH 200-200-20 MG/5ML PO SUSP: 30.0000 mL | Freq: Four times a day (QID) | ORAL | Status: DC | PRN

## 2012-01-08 MED ORDER — ACETAMINOPHEN 10 MG/ML IV SOLN
1000.0000 mg | Freq: Once | INTRAVENOUS | Status: AC | PRN
  Administered 2012-01-08: 1000 mg via INTRAVENOUS

## 2012-01-08 MED ORDER — DEXAMETHASONE SODIUM PHOSPHATE 4 MG/ML IJ SOLN
INTRAMUSCULAR | Status: DC | PRN
  Administered 2012-01-08: 10 mg via INTRAVENOUS

## 2012-01-08 MED ORDER — MUPIROCIN 2 % EX OINT
TOPICAL_OINTMENT | Freq: Two times a day (BID) | CUTANEOUS | Status: DC
  Administered 2012-01-08: 1 via NASAL
  Filled 2012-01-08: qty 22

## 2012-01-08 MED ORDER — HYDROMORPHONE HCL PF 1 MG/ML IJ SOLN
0.2500 mg | INTRAMUSCULAR | Status: DC | PRN
  Administered 2012-01-08 (×2): 0.5 mg via INTRAVENOUS

## 2012-01-08 MED ORDER — HYDROMORPHONE HCL PF 1 MG/ML IJ SOLN
INTRAMUSCULAR | Status: AC
  Filled 2012-01-08: qty 1

## 2012-01-08 MED ORDER — ACETAMINOPHEN 650 MG RE SUPP: 650.0000 mg | RECTAL | Status: DC | PRN

## 2012-01-08 MED ORDER — METFORMIN HCL 500 MG PO TABS
1000.0000 mg | ORAL_TABLET | Freq: Every day | ORAL | Status: DC
  Administered 2012-01-08 – 2012-01-09 (×2): 1000 mg via ORAL
  Filled 2012-01-08 (×3): qty 2

## 2012-01-08 MED ORDER — SODIUM CHLORIDE 0.9 % IR SOLN
Status: DC | PRN
  Administered 2012-01-08: 10:00:00

## 2012-01-08 MED ORDER — LIDOCAINE HCL (CARDIAC) 20 MG/ML IV SOLN
INTRAVENOUS | Status: DC | PRN
  Administered 2012-01-08: 50 mg via INTRAVENOUS

## 2012-01-08 MED ORDER — SODIUM CHLORIDE 0.9 % IJ SOLN: 3.0000 mL | INTRAMUSCULAR | Status: DC | PRN

## 2012-01-08 MED ORDER — BUPIVACAINE HCL (PF) 0.25 % IJ SOLN
INTRAMUSCULAR | Status: DC | PRN
  Administered 2012-01-08: 6 mL

## 2012-01-08 MED ORDER — ONDANSETRON HCL 4 MG/2ML IJ SOLN: 4.0000 mg | Freq: Once | INTRAMUSCULAR | Status: DC | PRN

## 2012-01-08 MED ORDER — FENTANYL CITRATE 0.05 MG/ML IJ SOLN
INTRAMUSCULAR | Status: DC | PRN
  Administered 2012-01-08: 50 ug via INTRAVENOUS
  Administered 2012-01-08: 150 ug via INTRAVENOUS
  Administered 2012-01-08: 50 ug via INTRAVENOUS
  Administered 2012-01-08: 100 ug via INTRAVENOUS
  Administered 2012-01-08: 50 ug via INTRAVENOUS

## 2012-01-08 MED ORDER — MENTHOL 3 MG MT LOZG: 1.0000 | LOZENGE | OROMUCOSAL | Status: DC | PRN

## 2012-01-08 MED ORDER — PHENOL 1.4 % MT LIQD
1.0000 | OROMUCOSAL | Status: DC | PRN
  Filled 2012-01-08: qty 177

## 2012-01-08 MED ORDER — SODIUM CHLORIDE 0.9 % IJ SOLN
3.0000 mL | Freq: Two times a day (BID) | INTRAMUSCULAR | Status: DC
  Administered 2012-01-08 – 2012-01-09 (×2): 3 mL via INTRAVENOUS

## 2012-01-08 MED ORDER — ACETAMINOPHEN 325 MG PO TABS: 650.0000 mg | ORAL_TABLET | ORAL | Status: DC | PRN

## 2012-01-08 MED ORDER — GLYCOPYRROLATE 0.2 MG/ML IJ SOLN
INTRAMUSCULAR | Status: DC | PRN
  Administered 2012-01-08: 0.4 mg via INTRAVENOUS

## 2012-01-08 MED ORDER — LACTATED RINGERS IV SOLN
INTRAVENOUS | Status: DC | PRN
  Administered 2012-01-08 (×2): via INTRAVENOUS

## 2012-01-08 MED ORDER — MIDAZOLAM HCL 5 MG/5ML IJ SOLN
INTRAMUSCULAR | Status: DC | PRN
  Administered 2012-01-08: 2 mg via INTRAVENOUS

## 2012-01-08 MED ORDER — ROCURONIUM BROMIDE 100 MG/10ML IV SOLN
INTRAVENOUS | Status: DC | PRN
  Administered 2012-01-08: 50 mg via INTRAVENOUS

## 2012-01-08 MED ORDER — ONDANSETRON HCL 4 MG/2ML IJ SOLN: 4.0000 mg | INTRAMUSCULAR | Status: DC | PRN

## 2012-01-08 SURGICAL SUPPLY — 67 items
ADH SKN CLS APL DERMABOND .7 (GAUZE/BANDAGES/DRESSINGS) ×1
BAG DECANTER FOR FLEXI CONT (MISCELLANEOUS) ×2 IMPLANT
BANDAGE GAUZE ELAST BULKY 4 IN (GAUZE/BANDAGES/DRESSINGS) IMPLANT
BIT DRILL 2.3 12 FIXED (INSTRUMENTS) IMPLANT
BIT DRILL NEURO 2X3.1 SFT TUCH (MISCELLANEOUS) ×1 IMPLANT
BUR BARREL STRAIGHT FLUTE 4.0 (BURR) ×2 IMPLANT
CAGE CORPECTOMY 36MM (Cage) ×1 IMPLANT
CANISTER SUCTION 2500CC (MISCELLANEOUS) ×2 IMPLANT
CLOTH BEACON ORANGE TIMEOUT ST (SAFETY) ×2 IMPLANT
CONT SPEC 4OZ CLIKSEAL STRL BL (MISCELLANEOUS) ×3 IMPLANT
DECANTER SPIKE VIAL GLASS SM (MISCELLANEOUS) ×2 IMPLANT
DERMABOND ADVANCED (GAUZE/BANDAGES/DRESSINGS) ×1
DERMABOND ADVANCED .7 DNX12 (GAUZE/BANDAGES/DRESSINGS) ×1 IMPLANT
DRAPE LAPAROTOMY 100X72 PEDS (DRAPES) ×2 IMPLANT
DRAPE MICROSCOPE LEICA (MISCELLANEOUS) IMPLANT
DRAPE POUCH INSTRU U-SHP 10X18 (DRAPES) ×2 IMPLANT
DRESSING TELFA 8X3 (GAUZE/BANDAGES/DRESSINGS) ×2 IMPLANT
DRILL 12MM (INSTRUMENTS) ×2
DRILL NEURO 2X3.1 SOFT TOUCH (MISCELLANEOUS) ×2
DRSG OPSITE 4X5.5 SM (GAUZE/BANDAGES/DRESSINGS) ×2 IMPLANT
DURAPREP 6ML APPLICATOR 50/CS (WOUND CARE) ×2 IMPLANT
ELECT REM PT RETURN 9FT ADLT (ELECTROSURGICAL) ×2
ELECTRODE REM PT RTRN 9FT ADLT (ELECTROSURGICAL) ×1 IMPLANT
GAUZE SPONGE 4X4 16PLY XRAY LF (GAUZE/BANDAGES/DRESSINGS) IMPLANT
GLOVE BIO SURGEON STRL SZ7.5 (GLOVE) IMPLANT
GLOVE BIOGEL PI IND STRL 7.0 (GLOVE) IMPLANT
GLOVE BIOGEL PI IND STRL 7.5 (GLOVE) IMPLANT
GLOVE BIOGEL PI IND STRL 8 (GLOVE) IMPLANT
GLOVE BIOGEL PI IND STRL 8.5 (GLOVE) ×1 IMPLANT
GLOVE BIOGEL PI INDICATOR 7.0 (GLOVE) ×1
GLOVE BIOGEL PI INDICATOR 7.5 (GLOVE)
GLOVE BIOGEL PI INDICATOR 8 (GLOVE) ×1
GLOVE BIOGEL PI INDICATOR 8.5 (GLOVE) ×1
GLOVE ECLIPSE 7.5 STRL STRAW (GLOVE) ×1 IMPLANT
GLOVE ECLIPSE 8.5 STRL (GLOVE) ×3 IMPLANT
GLOVE EXAM NITRILE LRG STRL (GLOVE) IMPLANT
GLOVE EXAM NITRILE MD LF STRL (GLOVE) ×1 IMPLANT
GLOVE EXAM NITRILE XL STR (GLOVE) IMPLANT
GLOVE EXAM NITRILE XS STR PU (GLOVE) IMPLANT
GLOVE INDICATOR 7.0 STRL GRN (GLOVE) ×1 IMPLANT
GOWN BRE IMP SLV AUR LG STRL (GOWN DISPOSABLE) ×1 IMPLANT
GOWN BRE IMP SLV AUR XL STRL (GOWN DISPOSABLE) ×2 IMPLANT
GOWN STRL REIN 2XL LVL4 (GOWN DISPOSABLE) ×4 IMPLANT
HEAD HALTER (SOFTGOODS) ×2 IMPLANT
KIT BASIN OR (CUSTOM PROCEDURE TRAY) ×2 IMPLANT
KIT ROOM TURNOVER OR (KITS) ×2 IMPLANT
NDL SPNL 22GX3.5 QUINCKE BK (NEEDLE) ×1 IMPLANT
NEEDLE HYPO 22GX1.5 SAFETY (NEEDLE) ×2 IMPLANT
NEEDLE SPNL 22GX3.5 QUINCKE BK (NEEDLE) ×2 IMPLANT
NS IRRIG 1000ML POUR BTL (IV SOLUTION) ×2 IMPLANT
PACK LAMINECTOMY NEURO (CUSTOM PROCEDURE TRAY) ×2 IMPLANT
PAD ARMBOARD 7.5X6 YLW CONV (MISCELLANEOUS) ×6 IMPLANT
PATTIES SURGICAL 1X1 (DISPOSABLE) ×1 IMPLANT
PLATE 45MM (Plate) ×2 IMPLANT
PLATE 45X3 LVL NS SPNE CVD (Plate) IMPLANT
PUTTY BONE 1CC ×1 IMPLANT
RUBBERBAND STERILE (MISCELLANEOUS) IMPLANT
SCREW 14MM (Screw) ×4 IMPLANT
SPONGE INTESTINAL PEANUT (DISPOSABLE) ×2 IMPLANT
SPONGE SURGIFOAM ABS GEL 100 (HEMOSTASIS) ×3 IMPLANT
SUT VIC AB 3-0 SH 8-18 (SUTURE) ×4 IMPLANT
SYR 20ML ECCENTRIC (SYRINGE) ×2 IMPLANT
TOWEL OR 17X24 6PK STRL BLUE (TOWEL DISPOSABLE) ×2 IMPLANT
TOWEL OR 17X26 10 PK STRL BLUE (TOWEL DISPOSABLE) ×2 IMPLANT
TRAP SPECIMEN MUCOUS 40CC (MISCELLANEOUS) ×1 IMPLANT
TRAY FOLEY CATH 14FRSI W/METER (CATHETERS) ×1 IMPLANT
WATER STERILE IRR 1000ML POUR (IV SOLUTION) ×2 IMPLANT

## 2012-01-08 NOTE — Op Note (Signed)
Preoperative diagnosis: Cervical spondylosis with myelopathy C4-5 C5-6 and C6-C7, cervical radiculopathy, ossification of the posterior longitudinal ligament Postoperative diagnosis: Cervical spondylosis with myelopathy C4-5 C5-6 C6-C7, cervical radiculopathy, ossification of posterior longitudinal ligament Procedure: Anterior cervical corpectomy C5 and C6 decompression of C5-C6 and C7 nerve roots bilaterally reconstruction with peek spacers C4-C7 anterior plate fixation with Alphatec trestle plate N5-A2. Surgeon: Barnett Abu Assistant: Karene Fry Anesthesia: Gen. endotracheal Indications: Patient is a 61 year old individual who had developed some weakness in her arms and hands and particularly weakness in her grips. She was seen by Dr. Cleone Slim Cypher who noticed a pattern of weakness and obtained a cervical spine film which showed advanced spondylitic disease at C4-C7. He referred her for neurosurgical evaluation and an MRI demonstrated that she had cord compression across C4-C7. Examination also revealed the presence of severe myelopathy with a an ataxic gait and significant spasticity in lower extremities in addition to weakness in the upper extremities. She was advised regarding the need for urgent surgery.  Procedure; the patient was brought to the operating room placed on the table in supine position after the smooth induction of general endotracheal anesthesia her head was placed in 5 pounds of halter traction. The neck was prepped with alcohol and DuraPrep and draped in a sterile fashion. A transverse incision was made on left side of the neck and the dissection was carried down through the platysma. The plane between the sternocleidomastoid and strap muscles dissected bluntly until the prevertebral space was reached. First been febrile disc space was noted to be that of C4-C5 on a radiograph. The patient then underwent dissection of the prevertebral space from C4 down to C7. Self-retaining Caspar type  retractor was placed under the longus coli muscle. A discectomy was then performed at C4-5 C5-6 and at C6-C7 removing large ventral osteophytes and removing obvious big osteophytes within the disc space. Once these areas were decompressed a corpectomy was performed removing the vertebral body of C5 and C6 with a Leksell rongeurs using a high-speed drill to drill down the posterior aspect of the bone and exposed the thickened calcified and ligamentous material underneath. Once this was released then a careful dissection of the ligamentous material was performed both in the central portion of the canal across C5 and C6 and also under the upper portion of the dissection at C4 and the lower part of the dissection under C7. The dissection was carried out laterally to the nerve roots and each of the nerve roots were dissected individually. Each nerve root was cleared at C5-C6 and C7. Large lateral osteophytes were also removed. This was done with a combination of a high-speed drill and a 1 and 2 mm Kerrison punch. When adequate decompression was felt to of been obtained the interbody space was sized for a peek spacer which measured 36 mm in length. This was filled with the bone that was harvested from the corpectomy. He was then inserted into the void. Hemostasis was then checked and the lateral gutters along the nerve roots. When this was obtained thoroughly a 4 5 mm plate was placed over the ventral aspect of the vertebral bodies from C4-C7. For 14 mm x 4 mm variable angle screws were used secure the plates. The remainder of the bone graft material is intact in the lateral gutters on the longus coli muscle. Hemostasis was then checked and all the soft tissues. 1 cc of demineralized bone matrix allograft was used to fill the voids along the contact surfaces between the  peek spacer and the endplates. Then again checking hemostasis and a final radiograph of the construct to close the platysma with 3-0 Vicryl in interrupted  fashion and 3-0 Vicryl was used to close subcuticular tissue. Blood loss was estimated at about 150 cc.

## 2012-01-08 NOTE — Transfer of Care (Signed)
Immediate Anesthesia Transfer of Care Note  Patient: Michelle Case  Procedure(s) Performed: Procedure(s) (LRB) with comments: ANTERIOR CERVICAL CORPECTOMY (N/A) - Cervical five-six Corpectomy, Cervical four-seven Arthrodesis  Patient Location: PACU  Anesthesia Type:General  Level of Consciousness: awake, alert , oriented and patient cooperative  Airway & Oxygen Therapy: Patient Spontanous Breathing and Patient connected to nasal cannula oxygen  Post-op Assessment: Report given to PACU RN, Post -op Vital signs reviewed and stable and Patient moving all extremities  Post vital signs: Reviewed and stable  Complications: No apparent anesthesia complications

## 2012-01-08 NOTE — Anesthesia Preprocedure Evaluation (Signed)
Anesthesia Evaluation  Patient identified by MRN, date of birth, ID band Patient awake    Reviewed: Allergy & Precautions, H&P , NPO status , Patient's Chart, lab work & pertinent test results  Airway       Dental  (+) Teeth Intact and Dental Advisory Given   Pulmonary  breath sounds clear to auscultation        Cardiovascular Rhythm:Regular Rate:Normal     Neuro/Psych    GI/Hepatic   Endo/Other    Renal/GU      Musculoskeletal   Abdominal (+) + obese,   Peds  Hematology   Anesthesia Other Findings   Reproductive/Obstetrics                           Anesthesia Physical Anesthesia Plan  ASA: III  Anesthesia Plan: General   Post-op Pain Management:    Induction: Intravenous  Airway Management Planned: Oral ETT  Additional Equipment:   Intra-op Plan:   Post-operative Plan: Extubation in OR  Informed Consent: I have reviewed the patients History and Physical, chart, labs and discussed the procedure including the risks, benefits and alternatives for the proposed anesthesia with the patient or authorized representative who has indicated his/her understanding and acceptance.   Dental advisory given  Plan Discussed with: CRNA and Surgeon  Anesthesia Plan Comments: (Cervical spondylosis with myelopathy with LE hyperreflexia htn GERD  Plan GA with oral ETT and glide scope  Kipp Brood, MD)        Anesthesia Quick Evaluation

## 2012-01-08 NOTE — H&P (Signed)
Michelle Case  #[id number]  DOB:  [birth year]       CHIEF COMPLAINT:   Weakness in her arm and hand.    HISTORY OF PRESENT ILLNESS:  Michelle Case is a 61 year old, right-handed individual who tells me that she has been having progressive weakness and numbness in her right arm and hand.  She was seen today by Dr. Josephine Igo who evaluated her and noted that she had global numbness in the whole arm and hand and very modest weakness, but then noted that her gait was unusual.  He noted also some hyperreflexia.  A C-spine x-ray was performed and this demonstrates the presence about 4 mm. of anterolisthesis of C4 on C5 with advanced degenerative changes at other levels in her neck including C5-6 and C6-C7.  He made the referral, so that Michelle Case could be seen today.    PHYSICAL EXAMINATION:  On initial presentation I note that she walks with a wide-based gait and is severely antalgic.  On further examination, I note that her motor strength is diffusely weak to 4/5 in the deltoid, bicep, tricep, grip and intrinsics on both hands, worse on the right hand.  Her tone and bulk in the major muscle groups is intact.  She does have some hyperreflexia in the bicep and tricep and a positive Hoffmann's in the hands.  Her motor strength reveals that her lower extremities are intact to tone and bulk, but she does have some scissoring and spasticity in the lower extremities as noted on her gait.    PAST MEDICAL HISTORY:  She has some hypertension and a history of some diabetes.    MEDICATIONS:    Current medications include Gabapentin, Glucovance, a blood pressure medication and Diclofenac.  She is also using Levemir insulin.    DRUG ALLERGIES:    She notes no allergies to any mediations.    REVIEW OF SYSTEMS:   Her systems review is notable for some weight loss, wearing of glasses, balance disturbance, high blood pressure, high cholesterol, swelling in the feet and hands, leg pain while walking, arm weakness, leg  weakness, back pain, arm pain, leg pain, joint, swelling, arthritis and neck pain and a history of diabetes on a 14-point review sheet.    IMPRESSION:    The patient has evidence of advanced spondylosis in the cervical spine with a degenerative spondylolisthesis at C4-C5.  I believe the most direct way to work this process up would be with an MRI of the cervical spine.  We will order this today and hopefully have an answer in the very near future, so we may direct her care in the appropriate fashion.    01/08/2012:     Michelle Case had an MRI of the cervical spine that was completed on 12/17/2011.  The study demonstrates that Michelle Case has evidence of cord compression at the level of C4-5, C5-6 and C6-7 with C5-6 being the singular worst level.  She has straightening of the midcervical spine.  She has anterolisthesis of about 3 mm at C4-5.  I believe she has ossification of the posterior longitudinal ligament that contributes to significant cord compression across these levels.   I demonstrated the findings to Michelle Case, and I believe that she is essentially needs a corpectomy of C5 and C6 in order to decompress the canal adequately.  I indicated that the surgery would be done by removing the discs at C4-5, 5-6 and 6-7 in their entirety and removing the center of the vertebrae at C5 and  C6 and decompressing the central portion of the canal.  Michelle Case has been having considerable difficulty with her upper extremities and there is concern for the potential for quadriparesis if nothing is done.  I noted to her that there are some intrinsic cord signal changes that need to be addressed also.  We should plan the surgery at the earliest convenience and will schedule this.  I did indicate to Michelle Case that she may need either inpatient rehabilitation or a period of convalescence before she returns to independent living.  She notes she has some coverage for home health care and this may come into play also during the  postoperative period but first we need to proceed with surgical decompression of her spinal canal in an effort to try to preserve the function she has and hopefully improve on some of the stuff she has lost.  I discussed the major risks of the surgery which include some potential for some swallowing difficulties, hoarseness of the voice, and we also discussed the fact that sometimes there is worsening of the neurologic status afterwards in a period of transition.  All of these things have to be considered carefully. Given the course of her myelopathy over the past couple of weeks, I believe that surgical decompression is urgent.

## 2012-01-08 NOTE — Progress Notes (Signed)
Call to dr. joslin for sign out 

## 2012-01-08 NOTE — Anesthesia Procedure Notes (Signed)
Procedure Name: Intubation Date/Time: 01/08/2012 10:16 AM Performed by: Ferol Luz L Pre-anesthesia Checklist: Patient identified, Emergency Drugs available, Suction available, Patient being monitored and Timeout performed Patient Re-evaluated:Patient Re-evaluated prior to inductionOxygen Delivery Method: Circle system utilized Preoxygenation: Pre-oxygenation with 100% oxygen Intubation Type: IV induction Ventilation: Mask ventilation without difficulty Tube type: Oral Tube size: 7.5 mm Number of attempts: 1 Airway Equipment and Method: Video-laryngoscopy and Stylet Placement Confirmation: ETT inserted through vocal cords under direct vision,  positive ETCO2,  CO2 detector and breath sounds checked- equal and bilateral Secured at: 21 cm Tube secured with: Tape Dental Injury: Teeth and Oropharynx as per pre-operative assessment  Difficulty Due To: Difficult Airway-  due to neck instability Comments: Surgeon request neutral intubation due to severe myelopathy

## 2012-01-08 NOTE — Progress Notes (Signed)
Patient ID: Michelle Case, female   DOB: 02/11/1951, 61 y.o.   MRN: [id number] Vital signs are stable. Upper extremities are moving with 4 minus out of 5 strength in both deltoids biceps triceps grips and intrinsics. Patient is awakening and the voices present but somewhat worse. Will observe overnight.

## 2012-01-08 NOTE — Progress Notes (Signed)
UR COMPLETED  

## 2012-01-08 NOTE — Anesthesia Postprocedure Evaluation (Signed)
  Anesthesia Post-op Note  Patient: Michelle Case  Procedure(s) Performed: Procedure(s) (LRB) with comments: ANTERIOR CERVICAL CORPECTOMY (N/A) - Cervical five-six Corpectomy, Cervical four-seven Arthrodesis  Patient Location: PACU  Anesthesia Type:General  Level of Consciousness: awake, alert  and oriented  Airway and Oxygen Therapy: Patient Spontanous Breathing and Patient connected to nasal cannula oxygen  Post-op Pain: mild  Post-op Assessment: Post-op Vital signs reviewed and Patient's Cardiovascular Status Stable  Post-op Vital Signs: stable  Complications: No apparent anesthesia complications

## 2012-01-09 ENCOUNTER — Encounter (HOSPITAL_COMMUNITY): Payer: Self-pay | Admitting: Neurological Surgery

## 2012-01-09 LAB — GLUCOSE, CAPILLARY
Glucose-Capillary: 190 mg/dL — ABNORMAL HIGH (ref 70–99)
Glucose-Capillary: 158 mg/dL — ABNORMAL HIGH (ref 70–99)
Glucose-Capillary: 112 mg/dL — ABNORMAL HIGH (ref 70–99)
Glucose-Capillary: 132 mg/dL — ABNORMAL HIGH (ref 70–99)

## 2012-01-09 NOTE — Evaluation (Signed)
Physical Therapy Evaluation Patient Details Name: Michelle Case MRN: [id number] DOB: 1950-12-03 Today's Date: 01/09/2012 Time: 7829-5621 PT Time Calculation (min): 22 min  PT Assessment / Plan / Recommendation Clinical Impression  Pt s/p cervical fusion.  Pt requiring emotional support due to the recent death of her husband.  Anxious about being in her home alone but does not want to pursue any other options.  Pt has good social support and feel she can manage with that support and home health.      PT Assessment  Patient needs continued PT services    Follow Up Recommendations  Home health PT    Does the patient have the potential to tolerate intense rehabilitation      Barriers to Discharge Decreased caregiver support      Equipment Recommendations  Hospital Bed. (Pt wants to try husband's rolling walker at home.  If it is adequate she will make arrangements with assist of home health.)    Recommendations for Other Services     Frequency Min 5X/week    Precautions / Restrictions Precautions Precautions: Fall   Pertinent Vitals/Pain Neck.  Unable to get comfortable in supine.  Required HOB to 80 degrees for comfort.      Mobility  Bed Mobility Bed Mobility: Sit to Supine Sit to Supine: 5: Supervision;HOB elevated Details for Bed Mobility Assistance: Incr time and HOB elevated to 80 degrees for pain control. Transfers Transfers: Sit to Stand;Stand to Sit Sit to Stand: 5: Supervision;With upper extremity assist;From bed Stand to Sit: 5: Supervision;With upper extremity assist;To bed Details for Transfer Assistance: Verbal cues for hand placement when coming to stand with walker in front. Ambulation/Gait Ambulation/Gait Assistance: 5: Supervision Ambulation Distance (Feet): 150 Feet Assistive device: Rolling walker Ambulation/Gait Assistance Details: verbal cues to stand more erect Gait Pattern: Step-through pattern;Decreased stride length;Trunk flexed Gait  velocity: decr    Shoulder Instructions     Exercises     PT Diagnosis: Difficulty walking;Generalized weakness;Acute pain  PT Problem List: Decreased strength;Decreased activity tolerance;Decreased mobility;Pain;Decreased knowledge of use of DME PT Treatment Interventions: DME instruction;Gait training;Functional mobility training;Patient/family education;Therapeutic activities   PT Goals Acute Rehab PT Goals PT Goal Formulation: With patient Time For Goal Achievement: 01/16/12 Potential to Achieve Goals: Good Pt will go Sit to Stand: with modified independence PT Goal: Sit to Stand - Progress: Goal set today Pt will go Stand to Sit: with modified independence PT Goal: Stand to Sit - Progress: Goal set today Pt will Ambulate: 51 - 150 feet;with modified independence;with least restrictive assistive device PT Goal: Ambulate - Progress: Goal set today  Visit Information  Last PT Received On: 01/09/12 Assistance Needed: +1    Subjective Data  Subjective: Pt stating she is still trying to adjust to losing her husband in September. Patient Stated Goal: Return home   Prior Functioning  Home Living Lives With: Alone Available Help at Discharge: Family;Friend(s);Available PRN/intermittently Type of Home: House Home Access: Ramped entrance Home Layout: Two level;Full bath on main level (bed upstairs) Alternate Level Stairs-Number of Steps: flight Bathroom Shower/Tub: Health visitor: Standard Bathroom Accessibility: Yes How Accessible: Accessible via walker;Accessible via wheelchair Home Adaptive Equipment: Built-in shower seat;Quad cane;Walker - rolling (walker was husband's who was 5'9") Prior Function Level of Independence: Independent with assistive device(s) Able to Take Stairs?: Yes Driving: Yes Vocation: Retired Musician: No difficulties Dominant Hand: Right    Cognition  Overall Cognitive Status: Appears within functional limits  for tasks assessed/performed Arousal/Alertness: Awake/alert Orientation  Level: Appears intact for tasks assessed Behavior During Session: Anxious Cognition - Other Comments: pt is fear of falling and is anxious about returning home    Extremity/Trunk Assessment Right Upper Extremity Assessment RUE ROM/Strength/Tone: Deficits RUE ROM/Strength/Tone Deficits: pt reports paresthesias, especially in first and second digits. Pt describes sensation as burning Right Lower Extremity Assessment RLE ROM/Strength/Tone: Deficits RLE ROM/Strength/Tone Deficits: grossly 4/5 Left Lower Extremity Assessment LLE ROM/Strength/Tone: Deficits LLE ROM/Strength/Tone Deficits: grossly 4/5   Balance Static Standing Balance Static Standing - Balance Support: No upper extremity supported;During functional activity Static Standing - Level of Assistance: 5: Stand by assistance  End of Session PT - End of Session Equipment Utilized During Treatment: Gait belt Activity Tolerance: Patient tolerated treatment well Patient left: in bed;with call bell/phone within reach Nurse Communication: Mobility status  GP     Talma Aguillard 01/09/2012, 10:07 AM  Skip Mayer PT [phone]

## 2012-01-09 NOTE — Progress Notes (Signed)
Inpatient Diabetes Program Recommendations  AACE/ADA: New Consensus Statement on Inpatient Glycemic Control (2013)  Target Ranges:  Prepandial:   less than 140 mg/dL      Peak postprandial:   less than 180 mg/dL (1-2 hours)      Critically ill patients:  140 - 180 mg/dL    Results for KELLYMARIE, MORMAN (MRN [id number]) as of 01/09/2012 10:26  Ref. Range 01/08/2012 08:12 01/08/2012 12:52 01/08/2012 17:58 01/08/2012 22:17  Glucose-Capillary Latest Range: 70-99 mg/dL 010 (H) 272 (H) 536 (H) 204 (H)     Inpatient Diabetes Program Recommendations Insulin - Basal: Please add 1/2 of patient's home Levemir dose- Levemir 9 units QHS. Correction (SSI): Please add Novolog Moderate correction scale (SSI) tid ac + HS.  Note: Will follow. Ambrose Finland RN, MSN, CDE Diabetes Coordinator Inpatient Diabetes Program [phone]

## 2012-01-09 NOTE — Progress Notes (Signed)
[facility]  Patient Status: New  [facility] is providing the following services: PT, OT and HHA  If patient discharges after hours, please call [phone].   Michelle Case 01/09/2012, 3:25 PM

## 2012-01-09 NOTE — Care Management Note (Addendum)
    Page 1 of 2   01/10/2012     10:28:51 AM   CARE MANAGEMENT NOTE 01/10/2012  Patient:  Michelle Case, Michelle Case   Account Number:  [id number]  Date Initiated:  01/09/2012  Documentation initiated by:  Anette Guarneri  Subjective/Objective Assessment:   ACDF  requires [facility] services and DME     Action/Plan:   home with [facility] services   Anticipated DC Date:  01/10/2012   Anticipated DC Plan:  HOME W HOME HEALTH SERVICES      DC Planning Services  CM consult      [facility] Choice  DURABLE MEDICAL EQUIPMENT  HOME HEALTH   Choice offered to / List presented to:  C-1 Patient   DME arranged  3-N-1  HOSPITAL BED      DME agency  [facility]     HH arranged  HH-2 PT  HH-3 OT  HH-4 NURSE'S AIDE      HH agency  [facility]   Status of service:  Completed, signed off Medicare Important Message given?   (If response is "NO", the following Medicare IM given date fields will be blank) Date Medicare IM given:   Date Additional Medicare IM given:    Discharge Disposition:  HOME W HOME HEALTH SERVICES  Per UR Regulation:  Reviewed for med. necessity/level of care/duration of stay  If discussed at Long Length of Stay Meetings, dates discussed:    Comments:  01/10/12  10:17  Anette Guarneri RN/CM received call late yesterday evening from [employer] informing this CM that Medicare and 2nd insurance would not cover cost of air mattress. Spoke with [name] this am and informed her regarding cost of Air mattress and that [facility] and insurance would not cover. Patient decided she did not want air mattress. Patient to d/c home with hospital bed and 3n1, HH services.          01/09/12 14:14 Anette Guarneri RN/CM Spoke with patient regarding d/c needs. Patient request that any Medicare Approved agencies be in network with her secondary  "[name]" Patient provide contact information for [name] Ph# [phone], opt #3, Op#6 for home care. Innetwork providers for  DME are [employer]/ph# [phone] and [phone] Patient request Hospital Bed w/Air mattress. Her Choice is [employer]/fax#[phone] att: Kim Patient choice for [facility] services is [facility], contacted [facility], arranged for [facility] services to begin after d/c home.

## 2012-01-09 NOTE — Evaluation (Signed)
Occupational Therapy Evaluation Patient Details Name: Michelle Case MRN: [id number] DOB: 1950-08-24 Today's Date: 01/09/2012 Time: 4098-1191 OT Time Calculation (min): 38 min  OT Assessment / Plan / Recommendation Clinical Impression  Anterior cervical corpectomy C5 and C6 decompression of C5-C6 and C7 nerve roots bilaterally reconstruction with peek spacers C4-C7 anterior plate fixation with Alphatec trestle plate [id number]. Pt presents with UE and LE weakness as well as RUE paresthesias and fine motor deficits which impact pt's safety and I with ADL. Pt will benefit from skilled OT in the acute setting to maximize I with ADL and ADL mobility prior to d/c.    OT Assessment  Patient needs continued OT Services    Follow Up Recommendations  Home health OT;Supervision/Assistance - 24 hour (and HH aide)    Barriers to Discharge Decreased caregiver support lives alone; has some family/friend support  Equipment Recommendations  Hospital bed    Recommendations for Other Services    Frequency  Min 2X/week    Precautions / Restrictions Precautions Precautions: Fall   Pertinent Vitals/Pain Pt reports 7/10 shoulder/UE pain; RN aware. Pt repositioned for pain relief    ADL  Grooming: Performed;Min guard Where Assessed - Grooming: Supported standing Lower Body Bathing: Min guard Where Assessed - Lower Body Bathing: Supported standing Upper Body Dressing: Min guard Where Assessed - Upper Body Dressing: Unsupported sitting Lower Body Dressing: Minimal assistance Where Assessed - Lower Body Dressing: Unsupported sit to stand Toilet Transfer: Min Pension scheme manager Method: Sit to Barista: Regular height toilet Toileting - Clothing Manipulation and Hygiene: Min guard Where Assessed - Engineer, mining and Hygiene: Standing Equipment Used: Gait belt Transfers/Ambulation Related to ADLs: Min HHA with ambulation; unsteady gait. Shuffling at  times ADL Comments: Pt is still mourning husband's death (in 12-08-2011)    OT Diagnosis: Generalized weakness;Acute pain  OT Problem List: Decreased strength;Decreased activity tolerance;Impaired balance (sitting and/or standing);Decreased coordination;Decreased knowledge of use of DME or AE;Decreased knowledge of precautions;Impaired sensation;Impaired UE functional use;Pain OT Treatment Interventions: Self-care/ADL training;DME and/or AE instruction;Therapeutic activities;Patient/family education;Balance training   OT Goals Acute Rehab OT Goals OT Goal Formulation: With patient Time For Goal Achievement: 01/16/12 Potential to Achieve Goals: Good ADL Goals Pt Will Perform Eating: with modified independence;Sitting, chair ADL Goal: Eating - Progress: Goal set today Pt Will Perform Grooming: Independently;Standing at sink ADL Goal: Grooming - Progress: Goal set today Pt Will Perform Upper Body Dressing: Independently;Sitting, bed;Sitting, chair ADL Goal: Upper Body Dressing - Progress: Goal set today Pt Will Perform Lower Body Dressing: with modified independence;Sit to stand from bed;Sit to stand from chair ADL Goal: Lower Body Dressing - Progress: Goal set today Pt Will Transfer to Toilet: with modified independence;Ambulation;with DME ADL Goal: Toilet Transfer - Progress: Goal set today Pt Will Perform Toileting - Clothing Manipulation: Independently;Standing ADL Goal: Toileting - Clothing Manipulation - Progress: Goal set today Additional ADL Goal #1: Pt will be I with bil UE strengthening and coordination HEP. ADL Goal: Additional Goal #1 - Progress: Goal set today  Visit Information  Last OT Received On: 01/09/12 Assistance Needed: +1    Subjective Data  Subjective: This hand feels like it was left out in the snow (right) Patient Stated Goal: Return home   Prior Functioning     Home Living Lives With: Alone Available Help at Discharge: Family;Friend(s);Available  PRN/intermittently Type of Home: House Home Access: Ramped entrance Home Layout: Two level;Full bath on main level (bed upstairs) Alternate Level Stairs-Number of Steps: flight  Bathroom Shower/Tub: Health visitor: Standard Bathroom Accessibility: Yes How Accessible: Accessible via walker;Accessible via wheelchair Home Adaptive Equipment: Built-in shower seat;Quad cane;Walker - rolling (walker was husband's who was 5'9") Prior Function Level of Independence: Independent with assistive device(s) Able to Take Stairs?: Yes Driving: Yes Vocation: Retired Musician: No difficulties Dominant Hand: Right         Vision/Perception     Cognition  Overall Cognitive Status: Appears within functional limits for tasks assessed/performed Arousal/Alertness: Awake/alert Orientation Level: Appears intact for tasks assessed Behavior During Session: Anxious Cognition - Other Comments: pt is fear of falling and is anxious about returning home    Extremity/Trunk Assessment Right Upper Extremity Assessment RUE ROM/Strength/Tone: Deficits RUE ROM/Strength/Tone Deficits: pt reports paresthesias, especially in first and second digits. Pt describes sensation as burning. Pt also grossly 4-/5 strength, limited by pain RUE Coordination: Deficits RUE Coordination Deficits: fine motor deficits Left Upper Extremity Assessment LUE ROM/Strength/Tone: Unable to fully assess;Due to pain LUE Sensation: WFL - Light Touch Right Lower Extremity Assessment RLE ROM/Strength/Tone: Deficits RLE ROM/Strength/Tone Deficits: grossly 4/5 Left Lower Extremity Assessment LLE ROM/Strength/Tone: Deficits LLE ROM/Strength/Tone Deficits: grossly 4/5     Mobility Bed Mobility Bed Mobility: Sit to Supine Sit to Supine: 5: Supervision;HOB elevated Details for Bed Mobility Assistance: Incr time and HOB elevated to 80 degrees for pain control. Transfers Sit to Stand: 5: Supervision;With  upper extremity assist;From bed Stand to Sit: 5: Supervision;With upper extremity assist;To bed Details for Transfer Assistance: Verbal cues for hand placement when coming to stand with walker in front.     Shoulder Instructions     Exercise     Balance Static Standing Balance Static Standing - Balance Support: No upper extremity supported;During functional activity Static Standing - Level of Assistance: 5: Stand by assistance   End of Session OT - End of Session Equipment Utilized During Treatment: Gait belt Activity Tolerance: Patient limited by pain Patient left: in bed (sitting EOB with PT) Nurse Communication: Mobility status  GO     Django Nguyen 01/09/2012, 11:35 AM

## 2012-01-09 NOTE — Progress Notes (Signed)
Subjective: Patient reports Complains of right arm weakness difficulty with swallowing difficulty with mobility. Requesting durable medical equipment for home use namely a hospital bed/May stay on the first floor of her house. Patient also describes difficulty with sensation of needing to urinate a Foley catheter was in place.  Objective: Vital signs in last 24 hours: Temp:  [97.6 F (36.4 C)-98.9 F (37.2 C)] 98.3 F (36.8 C) (11/06 0751) Pulse Rate:  [65-125] 108  (11/06 0751) Resp:  [10-18] 16  (11/06 0751) BP: (130-183)/(72-90) 137/83 mmHg (11/06 0751) SpO2:  [94 %-100 %] 94 % (11/06 0751)  Intake/Output from previous day: 11/05 0701 - 11/06 0700 In: 2360 [P.O.:960; I.V.:1400] Out: 2250 [Urine:2200; Blood:50] Intake/Output this shift:    Motor function demonstrates weak grip 3/5 in right upper extremity left-sided grip is 4/5 deltoid and biceps function is 4 minus out of 5 on right 4-5 on the left. Lower extremity function appears intact with patient moving legs while in bed. Relation not tested however patient has been out of bed.  Lab Results:  [facility] 01/08/12 0748  WBC 8.5  HGB 13.1  HCT 39.5  PLT 269   BMET  Basename 01/08/12 0748  NA 138  K 4.1  CL 100  CO2 26  GLUCOSE 119*  BUN 12  CREATININE 0.60  CALCIUM 9.9    Studies/Results: Dg Cervical Spine 2-3 Views  01/08/2012  *RADIOLOGY REPORT*  Clinical Data: C4-7 arthrodesis and C5-6 corpectomy  CERVICAL SPINE - 2-3 VIEW  Comparison: 12/17/2011 MRI  Findings: There is mild anterolisthesis of C4-C5.  A needle localizes the C4-5 disc space.  Endotracheal tube noted. No acute osseous change.  IMPRESSION: Needle localization at the C4-5 disc space.   Original Report Authenticated By: Jearld Lesch, M.D.     Assessment/Plan: Slight weakness in right upper extremity perhaps a little bit worse than preoperatively noted.  LOS: 1 day  Physical therapy occupational therapy and plans for home health needs and  assessments patient wishes to discuss her situation with the case manager. will obtain rehabilitation med consult also   Stefani Dama 01/09/2012, 8:43 AM

## 2012-01-10 LAB — GLUCOSE, CAPILLARY: Glucose-Capillary: 108 mg/dL — ABNORMAL HIGH (ref 70–99)

## 2012-01-10 MED ORDER — OXYCODONE-ACETAMINOPHEN 5-325 MG PO TABS: 1.0000 | ORAL_TABLET | ORAL | Status: DC | PRN

## 2012-01-10 MED ORDER — DIAZEPAM 5 MG PO TABS: 5.0000 mg | ORAL_TABLET | Freq: Four times a day (QID) | ORAL | Status: DC | PRN

## 2012-01-10 NOTE — Progress Notes (Signed)
Physical Therapy Discharge Patient Details Name: Michelle Case MRN: [id number] DOB: [birth year] Today's Date: 01/10/2012 Time: 9562-1308 PT Time Calculation (min): 13 min  Patient discharged from PT services secondary to goals met and no further acute PT needs identified.  Please see latest therapy progress note for current level of functioning and progress toward goals.    Progress and discharge plan discussed with patient and/or caregiver: Patient/Caregiver agrees with plan  GP     [facility] 01/10/2012, 9:27 AM

## 2012-01-10 NOTE — Progress Notes (Signed)
Physical Therapy Treatment Patient Details Name: Michelle Case MRN: [id number] DOB: August 12, 1950 Today's Date: 01/10/2012 Time: 6962-9528 PT Time Calculation (min): 13 min  PT Assessment / Plan / Recommendation Comments on Treatment Session  Pt s/p cervical fusion.  Pt now modified independent with transfers and gait.    Follow Up Recommendations  Home health PT     Does the patient have the potential to tolerate intense rehabilitation     Barriers to Discharge        Equipment Recommendations  Case bed    Recommendations for Other Services    Frequency     Plan Discharge plan remains appropriate;All goals met and education completed, patient dischaged from PT services    Precautions / Restrictions Precautions Precautions: Cervical   Pertinent Vitals/Pain Pain 5/10 in neck    Mobility  Transfers Sit to Stand: 7: Independent;With upper extremity assist;With armrests;From chair/3-in-1 Stand to Sit: 7: Independent;With upper extremity assist;With armrests;To chair/3-in-1 Ambulation/Gait Ambulation/Gait Assistance: 6: Modified independent (Device/Increase time) Ambulation Distance (Feet): 250 Feet Assistive device: Rolling walker;None Ambulation/Gait Assistance Details: Pt amb without assistive device in room and with rolling walker in hall. Gait Pattern: Step-through pattern;Decreased stride length Gait velocity: decr    Exercises     PT Diagnosis:    PT Problem List:   PT Treatment Interventions:     PT Goals Acute Rehab PT Goals PT Goal: Sit to Stand - Progress: Met PT Goal: Stand to Sit - Progress: Met PT Goal: Ambulate - Progress: Met  Visit Information  Last PT Received On: 01/10/12 Assistance Needed: +1    Subjective Data  Subjective: Pt stated she is ready to go home.   Cognition  Overall Cognitive Status: Appears within functional limits for tasks assessed/performed Arousal/Alertness: Awake/alert Orientation Level: Appears intact for tasks  assessed Behavior During Session: Michelle Case for tasks performed    Balance  Static Standing Balance Static Standing - Balance Support: No upper extremity supported Static Standing - Level of Assistance: 6: Modified independent (Device/Increase time)  End of Session PT - End of Session Equipment Utilized During Treatment: Cervical collar Activity Tolerance: Patient tolerated treatment well Patient left: in chair;with call bell/phone within reach Nurse Communication: Mobility status   GP     Michelle Case 01/10/2012, 9:26 AM  Skip Mayer PT [phone]

## 2012-01-10 NOTE — Progress Notes (Signed)
Occupational Therapy Treatment Patient Details Name: Michelle Case Salomon MRN: [id number] DOB: Dec 06, 1950 Today's Date: 01/10/2012 Time: 0865-7846 OT Time Calculation (min): 14 min  OT Assessment / Plan / Recommendation Comments on Treatment Session Pt educated on c-collar care/management    Follow Up Recommendations  Home health OT;Supervision/Assistance - 24 hour    Barriers to Discharge       Equipment Recommendations  Hospital bed    Recommendations for Other Services    Frequency     Plan Discharge plan remains appropriate    Precautions / Restrictions Precautions Precautions: Cervical   Pertinent Vitals/Pain Pt reports neck pain, but did not rate. Collar adjusted, pt reported improvement    ADL  Equipment Used: Gait belt;Rolling walker Transfers/Ambulation Related to ADLs: Mod I with RW ambulation throughout room/bathroom.  ADL Comments: Educated pt how to don/doff and care for C-collar. Pt able to do so, while looking in mirror with supervision. Pt able to verbalize/demonstrate RUE exercises for coordination/strength    OT Diagnosis:    OT Problem List:   OT Treatment Interventions:     OT Goals ADL Goals ADL Goal: Additional Goal #1 - Progress: Progressing toward goals  Visit Information  Last OT Received On: 01/10/12 Assistance Needed: +1    Subjective Data      Prior Functioning       Cognition  Overall Cognitive Status: Appears within functional limits for tasks assessed/performed Arousal/Alertness: Awake/alert Orientation Level: Appears intact for tasks assessed Behavior During Session: [facility] for tasks performed    Mobility  Shoulder Instructions         Exercises      Balance     End of Session OT - End of Session Activity Tolerance: Patient tolerated treatment well Patient left: in bed;with call bell/phone within reach Nurse Communication: Mobility status  GO     Lennis Korb 01/10/2012, 3:31 PM

## 2012-01-10 NOTE — Discharge Summary (Signed)
Physician Discharge Summary  Patient ID: Michelle Case MRN: [id number] DOB/AGE: 10-08-1950 61 y.o.  Admit date: 01/08/2012 Discharge date: 01/10/2012  Admission Diagnoses: Cervical spondylosis with myelopathy and radiculopathy C4-5 C5-6 and C6-C7, ossification the posterior longitudinal ligament  Discharge Diagnoses: Cervical spondylosis with myelopathy and radiculopathy C4-5 C5-6 and C6-C7, ossification the posterior longitudinal ligament Active Problems:  * No active hospital problems. *    Discharged Condition: fair  Hospital Course: Patient was admitted to undergo surgical decompression of her cervical spine having had myelopathic symptoms in her lower extremities and weakness in the upper extremities that has been progressively getting worse she is weaker on the right than she was on the left. Patient had an MRI that demonstrated severe disc degenerative changes at C4-5 C5-6 and C6-C7 with ossification of the posterior longitudinal ligament. She is advised regarding the need for urgent surgery.  Consults: Physical therapy, occupational therapy, rehabilitation medicine.  Significant Diagnostic Studies: MRI cervical spine  Treatments: surgery: Anterior cervical decompression with corpectomy C5 and C6 reconstruction with peek spacer local autograft anterior plate fixation with Alphatec trestle plate.  Discharge Exam: Blood pressure 163/92, pulse 93, temperature 97.7 F (36.5 C), temperature source Oral, resp. rate 18, height 5\' 1"  (1.549 m), weight 70.308 kg (155 lb), SpO2 98.00%. Incision is clean and dry. Motor function in right upper extremity reveals 4-5 grip strength 4-5 intrinsic strength 4-5 triceps and biceps strength left-sided strength is slightly better but still graded at 4-5  Disposition: 01-Home or Self Care  Discharge Orders    Future Orders Please Complete By Expires   Diet - low sodium heart healthy      Increase activity slowly      Discharge instructions       Comments:   Okay to shower. Do not apply salves or appointments to incision. No heavy lifting with the upper extremities greater than 15 pounds. May resume driving when not requiring pain medication and patient feels comfortable with doing so.   Call MD for:  redness, tenderness, or signs of infection (pain, swelling, redness, odor or green/yellow discharge around incision site)      Call MD for:  severe uncontrolled pain      Call MD for:  temperature >100.4          Medication List     As of 01/10/2012  8:39 AM    TAKE these medications         diazepam 5 MG tablet   Commonly known as: VALIUM   Take 1 tablet (5 mg total) by mouth every 6 (six) hours as needed (Muscle spasm).      diclofenac 75 MG EC tablet   Commonly known as: VOLTAREN   Take 75 mg by mouth 2 (two) times daily.      gabapentin 300 MG capsule   Commonly known as: NEURONTIN   Take 300 mg by mouth 4 (four) times daily.      glyBURIDE-metformin 5-500 MG per tablet   Commonly known as: GLUCOVANCE   Take 1-2 tablets by mouth 2 (two) times daily with a meal. Takes 1 tablet in am and 2 tablets in pm      LEVEMIR FLEXPEN [place]   Inject 18 Units into the skin at bedtime.      misoprostol 200 MCG tablet   Commonly known as: CYTOTEC   Take 200 mcg by mouth 2 (two) times daily. Take with diclofenac      multivitamin-iron-minerals-folic acid chewable tablet   Chew  1 tablet by mouth daily.      omeprazole 20 MG capsule   Commonly known as: PRILOSEC   Take 1 capsule (20 mg total) by mouth daily.      oxyCODONE-acetaminophen 5-325 MG per tablet   Commonly known as: PERCOCET/ROXICET   Take 1-2 tablets by mouth every 4 (four) hours as needed for pain.      ramipril 5 MG capsule   Commonly known as: ALTACE   Take 5 mg by mouth daily.      vitamin C 1000 MG tablet   Take 1,000 mg by mouth 2 (two) times daily.      Vitamin D (Ergocalciferol) 50000 UNITS Caps   Commonly known as: DRISDOL   Take 50,000 Units by  mouth every 7 (seven) days.         SignedStefani Dama 01/10/2012, 8:39 AM

## 2014-04-08 IMAGING — CR DG CERVICAL SPINE 2 OR 3 VIEWS
2 series · 2 of 2 positions shown · non-contrast
Comparison: 12/17/2011 MRI

CLINICAL DATA: C4-7 arthrodesis and C5-6 corpectomy

CERVICAL SPINE - 2-3 VIEW

[view not recorded (1 of 2)]
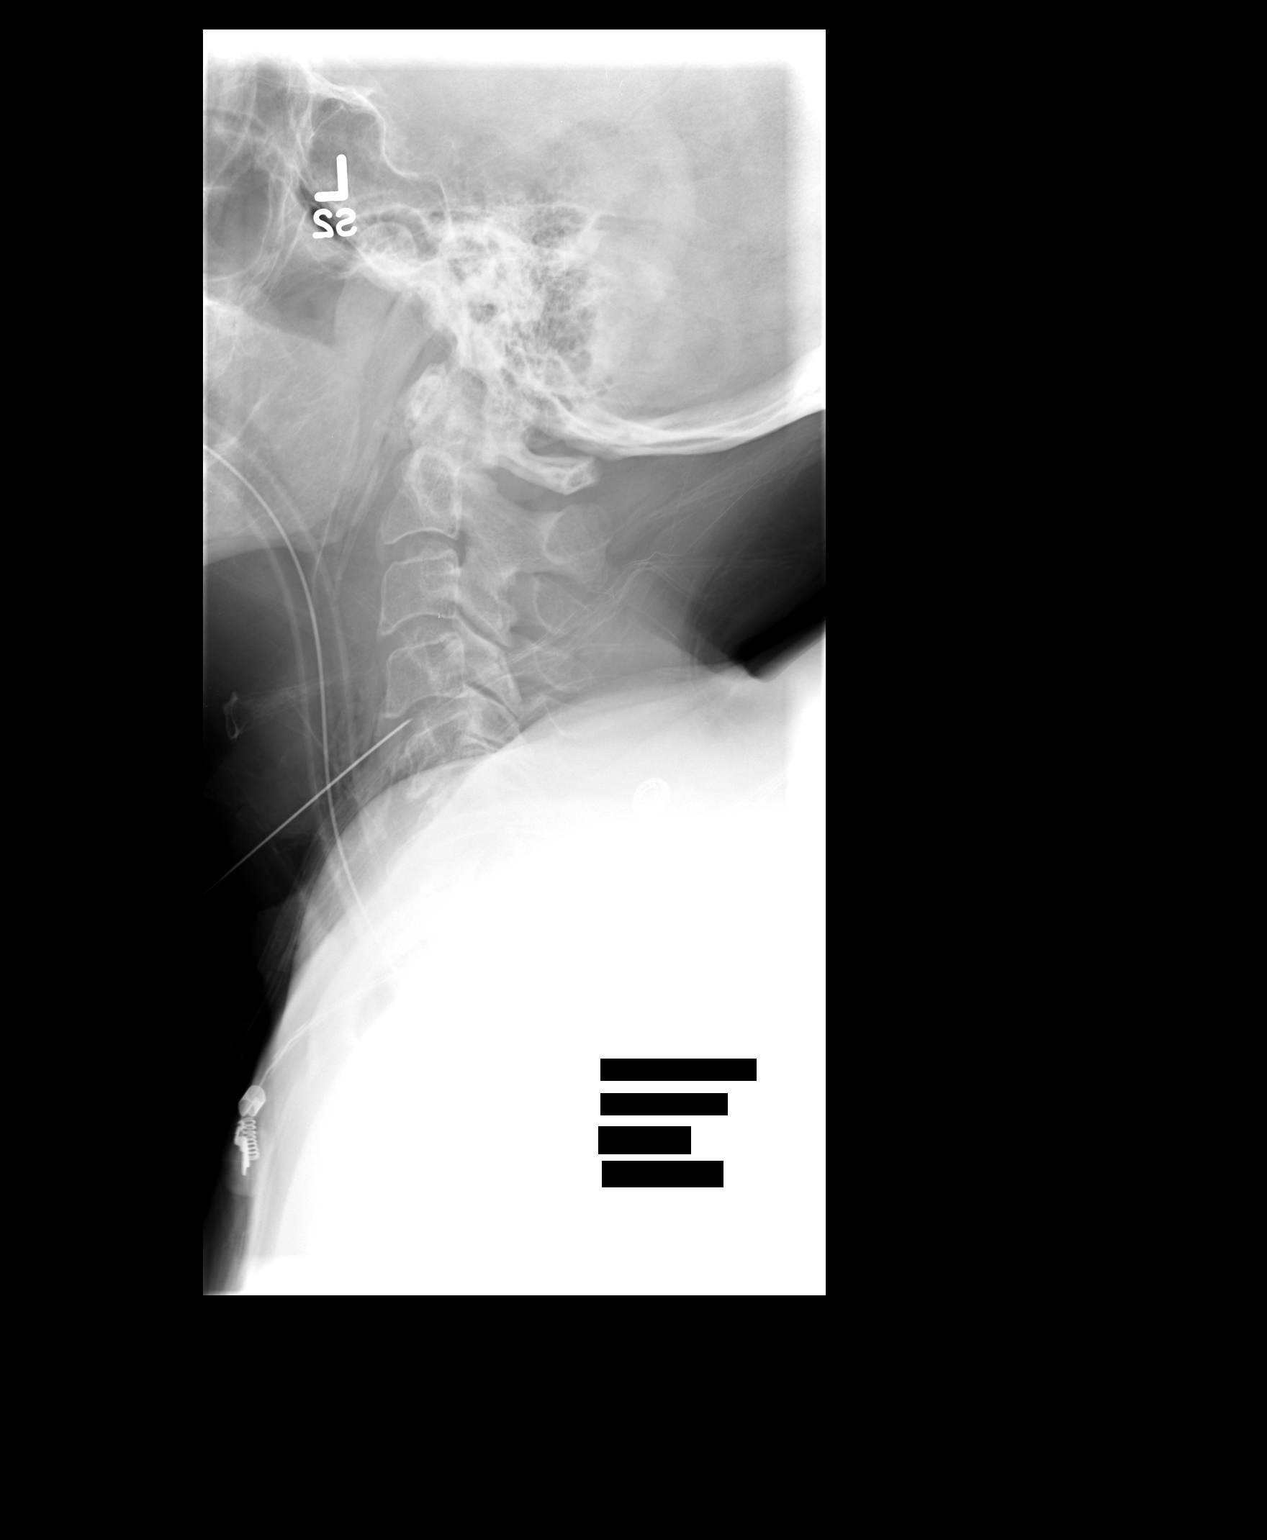

[view not recorded (2 of 2)]
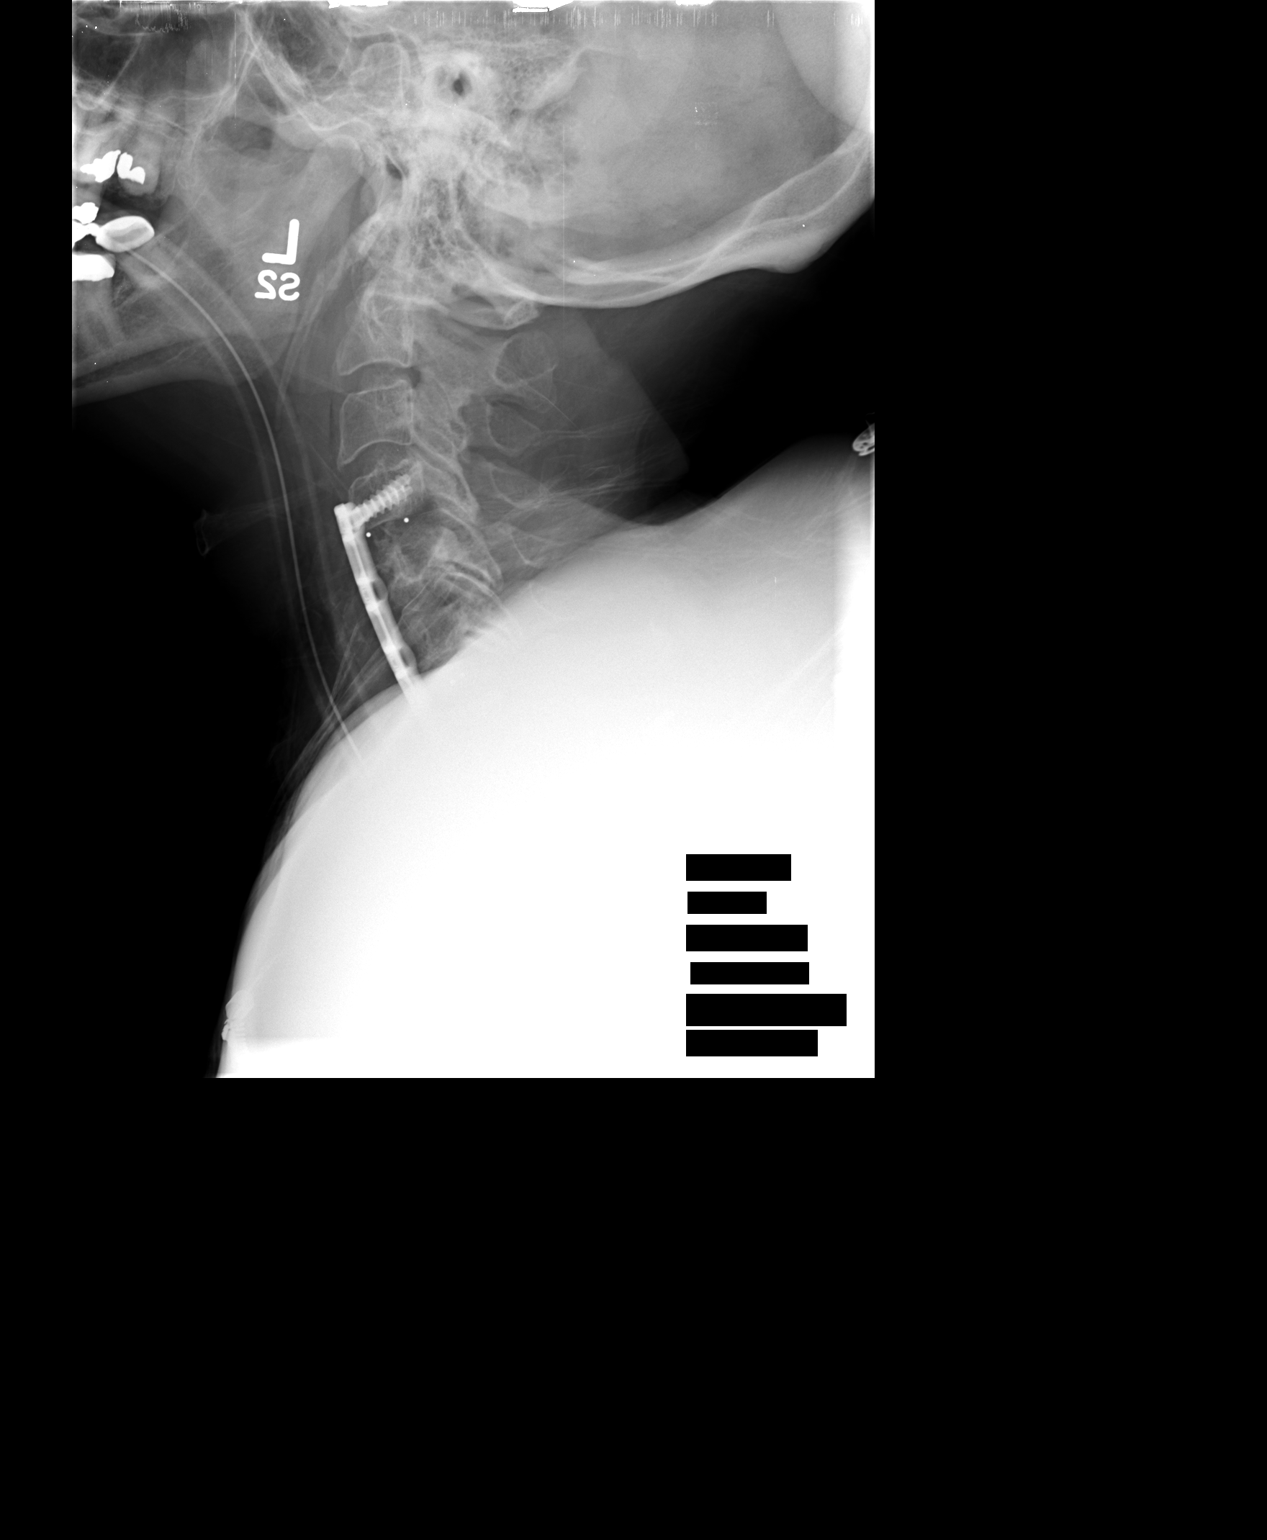

[2 of 2 positions shown; findings below may reference images not displayed]

FINDINGS: There is mild anterolisthesis of C4-C5.  A needle
localizes the C4-5 disc space.  Endotracheal tube noted. No acute
osseous change.
IMPRESSION: Needle localization at the C4-5 disc space.

## 2014-08-12 ENCOUNTER — Ambulatory Visit (INDEPENDENT_AMBULATORY_CARE_PROVIDER_SITE_OTHER): Payer: Medicare Other | Admitting: Otolaryngology

## 2014-08-12 DIAGNOSIS — D440 Neoplasm of uncertain behavior of thyroid gland: Secondary | ICD-10-CM | POA: Diagnosis not present

## 2015-03-18 DIAGNOSIS — F331 Major depressive disorder, recurrent, moderate: Secondary | ICD-10-CM | POA: Diagnosis not present

## 2015-03-23 DIAGNOSIS — F331 Major depressive disorder, recurrent, moderate: Secondary | ICD-10-CM | POA: Diagnosis not present

## 2015-05-02 DIAGNOSIS — I10 Essential (primary) hypertension: Secondary | ICD-10-CM | POA: Diagnosis not present

## 2015-05-02 DIAGNOSIS — M545 Low back pain: Secondary | ICD-10-CM | POA: Diagnosis not present

## 2015-05-02 DIAGNOSIS — E1165 Type 2 diabetes mellitus with hyperglycemia: Secondary | ICD-10-CM | POA: Diagnosis not present

## 2015-05-18 DIAGNOSIS — I10 Essential (primary) hypertension: Secondary | ICD-10-CM | POA: Diagnosis not present

## 2015-05-18 DIAGNOSIS — E119 Type 2 diabetes mellitus without complications: Secondary | ICD-10-CM | POA: Diagnosis not present

## 2015-05-18 DIAGNOSIS — M159 Polyosteoarthritis, unspecified: Secondary | ICD-10-CM | POA: Diagnosis not present

## 2015-06-30 DIAGNOSIS — E1165 Type 2 diabetes mellitus with hyperglycemia: Secondary | ICD-10-CM | POA: Diagnosis not present

## 2015-06-30 DIAGNOSIS — I10 Essential (primary) hypertension: Secondary | ICD-10-CM | POA: Diagnosis not present

## 2015-06-30 DIAGNOSIS — M545 Low back pain: Secondary | ICD-10-CM | POA: Diagnosis not present

## 2015-06-30 DIAGNOSIS — N390 Urinary tract infection, site not specified: Secondary | ICD-10-CM | POA: Diagnosis not present

## 2015-06-30 DIAGNOSIS — R350 Frequency of micturition: Secondary | ICD-10-CM | POA: Diagnosis not present

## 2015-07-06 DIAGNOSIS — G959 Disease of spinal cord, unspecified: Secondary | ICD-10-CM | POA: Diagnosis not present

## 2015-07-06 DIAGNOSIS — M419 Scoliosis, unspecified: Secondary | ICD-10-CM | POA: Diagnosis not present

## 2015-07-06 DIAGNOSIS — Z6831 Body mass index (BMI) 31.0-31.9, adult: Secondary | ICD-10-CM | POA: Diagnosis not present

## 2015-07-14 DIAGNOSIS — M159 Polyosteoarthritis, unspecified: Secondary | ICD-10-CM | POA: Diagnosis not present

## 2015-07-14 DIAGNOSIS — I10 Essential (primary) hypertension: Secondary | ICD-10-CM | POA: Diagnosis not present

## 2015-07-14 DIAGNOSIS — E119 Type 2 diabetes mellitus without complications: Secondary | ICD-10-CM | POA: Diagnosis not present

## 2015-07-27 DIAGNOSIS — F419 Anxiety disorder, unspecified: Secondary | ICD-10-CM | POA: Diagnosis not present

## 2015-07-29 DIAGNOSIS — Z1231 Encounter for screening mammogram for malignant neoplasm of breast: Secondary | ICD-10-CM | POA: Diagnosis not present

## 2015-08-05 DIAGNOSIS — Z87891 Personal history of nicotine dependence: Secondary | ICD-10-CM | POA: Diagnosis not present

## 2015-08-05 DIAGNOSIS — I10 Essential (primary) hypertension: Secondary | ICD-10-CM | POA: Diagnosis not present

## 2015-08-05 DIAGNOSIS — E1165 Type 2 diabetes mellitus with hyperglycemia: Secondary | ICD-10-CM | POA: Diagnosis not present

## 2015-08-05 DIAGNOSIS — R252 Cramp and spasm: Secondary | ICD-10-CM | POA: Diagnosis not present

## 2015-08-05 DIAGNOSIS — Z6829 Body mass index (BMI) 29.0-29.9, adult: Secondary | ICD-10-CM | POA: Diagnosis not present

## 2015-08-08 DIAGNOSIS — F419 Anxiety disorder, unspecified: Secondary | ICD-10-CM | POA: Diagnosis not present

## 2015-08-15 DIAGNOSIS — M79606 Pain in leg, unspecified: Secondary | ICD-10-CM | POA: Diagnosis not present

## 2015-08-15 DIAGNOSIS — M79604 Pain in right leg: Secondary | ICD-10-CM | POA: Diagnosis not present

## 2015-08-17 DIAGNOSIS — F419 Anxiety disorder, unspecified: Secondary | ICD-10-CM | POA: Diagnosis not present

## 2015-08-24 DIAGNOSIS — F419 Anxiety disorder, unspecified: Secondary | ICD-10-CM | POA: Diagnosis not present

## 2015-08-25 ENCOUNTER — Other Ambulatory Visit (INDEPENDENT_AMBULATORY_CARE_PROVIDER_SITE_OTHER): Payer: Self-pay | Admitting: Otolaryngology

## 2015-08-25 DIAGNOSIS — E041 Nontoxic single thyroid nodule: Secondary | ICD-10-CM

## 2015-08-31 ENCOUNTER — Ambulatory Visit (HOSPITAL_COMMUNITY)
Admission: RE | Admit: 2015-08-31 | Discharge: 2015-08-31 | Disposition: A | Discharge status: Home / Self Care | Payer: Medicare Other | Source: Ambulatory Visit | Attending: Otolaryngology | Admitting: Otolaryngology

## 2015-08-31 DIAGNOSIS — R221 Localized swelling, mass and lump, neck: Secondary | ICD-10-CM | POA: Diagnosis not present

## 2015-08-31 DIAGNOSIS — E042 Nontoxic multinodular goiter: Secondary | ICD-10-CM | POA: Insufficient documentation

## 2015-08-31 DIAGNOSIS — E041 Nontoxic single thyroid nodule: Secondary | ICD-10-CM

## 2015-08-31 DIAGNOSIS — F419 Anxiety disorder, unspecified: Secondary | ICD-10-CM | POA: Diagnosis not present

## 2015-09-12 ENCOUNTER — Ambulatory Visit (INDEPENDENT_AMBULATORY_CARE_PROVIDER_SITE_OTHER): Payer: Medicare Other | Admitting: Otolaryngology

## 2015-09-12 DIAGNOSIS — D440 Neoplasm of uncertain behavior of thyroid gland: Secondary | ICD-10-CM | POA: Diagnosis not present

## 2015-09-15 DIAGNOSIS — R5383 Other fatigue: Secondary | ICD-10-CM | POA: Diagnosis not present

## 2015-09-15 DIAGNOSIS — Z1389 Encounter for screening for other disorder: Secondary | ICD-10-CM | POA: Diagnosis not present

## 2015-09-15 DIAGNOSIS — Z1211 Encounter for screening for malignant neoplasm of colon: Secondary | ICD-10-CM | POA: Diagnosis not present

## 2015-09-15 DIAGNOSIS — Z79899 Other long term (current) drug therapy: Secondary | ICD-10-CM | POA: Diagnosis not present

## 2015-09-15 DIAGNOSIS — Z299 Encounter for prophylactic measures, unspecified: Secondary | ICD-10-CM | POA: Diagnosis not present

## 2015-09-15 DIAGNOSIS — E7800 Pure hypercholesterolemia, unspecified: Secondary | ICD-10-CM | POA: Diagnosis not present

## 2015-09-15 DIAGNOSIS — Z0000 Encounter for general adult medical examination without abnormal findings: Secondary | ICD-10-CM | POA: Diagnosis not present

## 2015-09-15 DIAGNOSIS — Z7189 Other specified counseling: Secondary | ICD-10-CM | POA: Diagnosis not present

## 2015-09-22 DIAGNOSIS — E119 Type 2 diabetes mellitus without complications: Secondary | ICD-10-CM | POA: Diagnosis not present

## 2015-09-22 DIAGNOSIS — M159 Polyosteoarthritis, unspecified: Secondary | ICD-10-CM | POA: Diagnosis not present

## 2015-09-22 DIAGNOSIS — I10 Essential (primary) hypertension: Secondary | ICD-10-CM | POA: Diagnosis not present

## 2015-09-28 DIAGNOSIS — F419 Anxiety disorder, unspecified: Secondary | ICD-10-CM | POA: Diagnosis not present

## 2015-10-05 DIAGNOSIS — F419 Anxiety disorder, unspecified: Secondary | ICD-10-CM | POA: Diagnosis not present

## 2015-10-12 DIAGNOSIS — F419 Anxiety disorder, unspecified: Secondary | ICD-10-CM | POA: Diagnosis not present

## 2015-10-19 DIAGNOSIS — F419 Anxiety disorder, unspecified: Secondary | ICD-10-CM | POA: Diagnosis not present

## 2015-10-26 DIAGNOSIS — F419 Anxiety disorder, unspecified: Secondary | ICD-10-CM | POA: Diagnosis not present

## 2015-11-02 DIAGNOSIS — F419 Anxiety disorder, unspecified: Secondary | ICD-10-CM | POA: Diagnosis not present

## 2015-11-03 DIAGNOSIS — E119 Type 2 diabetes mellitus without complications: Secondary | ICD-10-CM | POA: Diagnosis not present

## 2015-11-03 DIAGNOSIS — I10 Essential (primary) hypertension: Secondary | ICD-10-CM | POA: Diagnosis not present

## 2015-11-03 DIAGNOSIS — M159 Polyosteoarthritis, unspecified: Secondary | ICD-10-CM | POA: Diagnosis not present

## 2015-11-16 DIAGNOSIS — F419 Anxiety disorder, unspecified: Secondary | ICD-10-CM | POA: Diagnosis not present

## 2015-11-23 DIAGNOSIS — M545 Low back pain: Secondary | ICD-10-CM | POA: Diagnosis not present

## 2015-11-23 DIAGNOSIS — Z299 Encounter for prophylactic measures, unspecified: Secondary | ICD-10-CM | POA: Diagnosis not present

## 2015-11-23 DIAGNOSIS — Z90710 Acquired absence of both cervix and uterus: Secondary | ICD-10-CM | POA: Diagnosis not present

## 2015-11-23 DIAGNOSIS — I10 Essential (primary) hypertension: Secondary | ICD-10-CM | POA: Diagnosis not present

## 2015-11-23 DIAGNOSIS — E1165 Type 2 diabetes mellitus with hyperglycemia: Secondary | ICD-10-CM | POA: Diagnosis not present

## 2015-11-30 DIAGNOSIS — F419 Anxiety disorder, unspecified: Secondary | ICD-10-CM | POA: Diagnosis not present

## 2015-12-07 DIAGNOSIS — F419 Anxiety disorder, unspecified: Secondary | ICD-10-CM | POA: Diagnosis not present

## 2015-12-21 DIAGNOSIS — F419 Anxiety disorder, unspecified: Secondary | ICD-10-CM | POA: Diagnosis not present

## 2015-12-29 DIAGNOSIS — M419 Scoliosis, unspecified: Secondary | ICD-10-CM | POA: Diagnosis not present

## 2016-01-04 DIAGNOSIS — F419 Anxiety disorder, unspecified: Secondary | ICD-10-CM | POA: Diagnosis not present

## 2016-01-11 DIAGNOSIS — F419 Anxiety disorder, unspecified: Secondary | ICD-10-CM | POA: Diagnosis not present

## 2016-01-25 DIAGNOSIS — F419 Anxiety disorder, unspecified: Secondary | ICD-10-CM | POA: Diagnosis not present

## 2016-02-08 DIAGNOSIS — F419 Anxiety disorder, unspecified: Secondary | ICD-10-CM | POA: Diagnosis not present

## 2016-03-19 DIAGNOSIS — Z299 Encounter for prophylactic measures, unspecified: Secondary | ICD-10-CM | POA: Diagnosis not present

## 2016-03-19 DIAGNOSIS — E1165 Type 2 diabetes mellitus with hyperglycemia: Secondary | ICD-10-CM | POA: Diagnosis not present

## 2016-03-19 DIAGNOSIS — Z789 Other specified health status: Secondary | ICD-10-CM | POA: Diagnosis not present

## 2016-03-19 DIAGNOSIS — I10 Essential (primary) hypertension: Secondary | ICD-10-CM | POA: Diagnosis not present

## 2016-03-19 DIAGNOSIS — Z6830 Body mass index (BMI) 30.0-30.9, adult: Secondary | ICD-10-CM | POA: Diagnosis not present

## 2016-03-19 DIAGNOSIS — L98499 Non-pressure chronic ulcer of skin of other sites with unspecified severity: Secondary | ICD-10-CM | POA: Diagnosis not present

## 2016-03-19 DIAGNOSIS — Z713 Dietary counseling and surveillance: Secondary | ICD-10-CM | POA: Diagnosis not present

## 2016-04-19 DIAGNOSIS — I10 Essential (primary) hypertension: Secondary | ICD-10-CM | POA: Diagnosis not present

## 2016-04-19 DIAGNOSIS — E119 Type 2 diabetes mellitus without complications: Secondary | ICD-10-CM | POA: Diagnosis not present

## 2016-04-19 DIAGNOSIS — M159 Polyosteoarthritis, unspecified: Secondary | ICD-10-CM | POA: Diagnosis not present

## 2016-06-26 DIAGNOSIS — E1165 Type 2 diabetes mellitus with hyperglycemia: Secondary | ICD-10-CM | POA: Diagnosis not present

## 2016-06-26 DIAGNOSIS — G459 Transient cerebral ischemic attack, unspecified: Secondary | ICD-10-CM | POA: Diagnosis not present

## 2016-06-26 DIAGNOSIS — F329 Major depressive disorder, single episode, unspecified: Secondary | ICD-10-CM | POA: Diagnosis not present

## 2016-06-26 DIAGNOSIS — M545 Low back pain: Secondary | ICD-10-CM | POA: Diagnosis not present

## 2016-06-26 DIAGNOSIS — Z6830 Body mass index (BMI) 30.0-30.9, adult: Secondary | ICD-10-CM | POA: Diagnosis not present

## 2016-06-26 DIAGNOSIS — E7800 Pure hypercholesterolemia, unspecified: Secondary | ICD-10-CM | POA: Diagnosis not present

## 2016-06-26 DIAGNOSIS — M47817 Spondylosis without myelopathy or radiculopathy, lumbosacral region: Secondary | ICD-10-CM | POA: Diagnosis not present

## 2016-06-26 DIAGNOSIS — I10 Essential (primary) hypertension: Secondary | ICD-10-CM | POA: Diagnosis not present

## 2016-06-26 DIAGNOSIS — R200 Anesthesia of skin: Secondary | ICD-10-CM | POA: Diagnosis not present

## 2016-06-26 DIAGNOSIS — E041 Nontoxic single thyroid nodule: Secondary | ICD-10-CM | POA: Diagnosis not present

## 2016-06-26 DIAGNOSIS — R202 Paresthesia of skin: Secondary | ICD-10-CM | POA: Diagnosis not present

## 2016-06-26 DIAGNOSIS — Z299 Encounter for prophylactic measures, unspecified: Secondary | ICD-10-CM | POA: Diagnosis not present

## 2016-06-29 DIAGNOSIS — E119 Type 2 diabetes mellitus without complications: Secondary | ICD-10-CM | POA: Diagnosis not present

## 2016-06-29 DIAGNOSIS — I10 Essential (primary) hypertension: Secondary | ICD-10-CM | POA: Diagnosis not present

## 2016-06-29 DIAGNOSIS — M159 Polyosteoarthritis, unspecified: Secondary | ICD-10-CM | POA: Diagnosis not present

## 2016-08-08 ENCOUNTER — Other Ambulatory Visit (INDEPENDENT_AMBULATORY_CARE_PROVIDER_SITE_OTHER): Payer: Self-pay | Admitting: Otolaryngology

## 2016-08-08 DIAGNOSIS — E041 Nontoxic single thyroid nodule: Secondary | ICD-10-CM

## 2016-09-06 ENCOUNTER — Encounter (INDEPENDENT_AMBULATORY_CARE_PROVIDER_SITE_OTHER): Payer: Self-pay | Admitting: *Deleted

## 2016-09-17 ENCOUNTER — Ambulatory Visit (HOSPITAL_COMMUNITY)
Admission: RE | Admit: 2016-09-17 | Discharge: 2016-09-17 | Disposition: A | Discharge status: Home / Self Care | Payer: Medicare Other | Source: Ambulatory Visit | Attending: Otolaryngology | Admitting: Otolaryngology

## 2016-09-17 DIAGNOSIS — Z6830 Body mass index (BMI) 30.0-30.9, adult: Secondary | ICD-10-CM | POA: Diagnosis not present

## 2016-09-17 DIAGNOSIS — G5600 Carpal tunnel syndrome, unspecified upper limb: Secondary | ICD-10-CM | POA: Diagnosis not present

## 2016-09-17 DIAGNOSIS — E041 Nontoxic single thyroid nodule: Secondary | ICD-10-CM | POA: Diagnosis not present

## 2016-09-17 DIAGNOSIS — M545 Low back pain: Secondary | ICD-10-CM | POA: Diagnosis not present

## 2016-09-17 DIAGNOSIS — R21 Rash and other nonspecific skin eruption: Secondary | ICD-10-CM | POA: Diagnosis not present

## 2016-09-17 DIAGNOSIS — Z299 Encounter for prophylactic measures, unspecified: Secondary | ICD-10-CM | POA: Diagnosis not present

## 2016-09-17 DIAGNOSIS — Z79899 Other long term (current) drug therapy: Secondary | ICD-10-CM | POA: Diagnosis not present

## 2016-09-17 DIAGNOSIS — I10 Essential (primary) hypertension: Secondary | ICD-10-CM | POA: Diagnosis not present

## 2016-09-17 DIAGNOSIS — E7800 Pure hypercholesterolemia, unspecified: Secondary | ICD-10-CM | POA: Diagnosis not present

## 2016-09-17 DIAGNOSIS — Z7189 Other specified counseling: Secondary | ICD-10-CM | POA: Diagnosis not present

## 2016-09-17 DIAGNOSIS — E1165 Type 2 diabetes mellitus with hyperglycemia: Secondary | ICD-10-CM | POA: Diagnosis not present

## 2016-09-17 DIAGNOSIS — Z1389 Encounter for screening for other disorder: Secondary | ICD-10-CM | POA: Diagnosis not present

## 2016-09-17 DIAGNOSIS — Z0000 Encounter for general adult medical examination without abnormal findings: Secondary | ICD-10-CM | POA: Diagnosis not present

## 2016-09-17 DIAGNOSIS — R5383 Other fatigue: Secondary | ICD-10-CM | POA: Diagnosis not present

## 2016-09-17 DIAGNOSIS — Z1211 Encounter for screening for malignant neoplasm of colon: Secondary | ICD-10-CM | POA: Diagnosis not present

## 2016-09-19 ENCOUNTER — Ambulatory Visit (HOSPITAL_COMMUNITY): Payer: Medicare Other

## 2016-09-24 ENCOUNTER — Ambulatory Visit (INDEPENDENT_AMBULATORY_CARE_PROVIDER_SITE_OTHER): Payer: Medicare Other | Admitting: Otolaryngology

## 2016-09-24 DIAGNOSIS — D440 Neoplasm of uncertain behavior of thyroid gland: Secondary | ICD-10-CM

## 2016-09-24 DIAGNOSIS — H6123 Impacted cerumen, bilateral: Secondary | ICD-10-CM | POA: Diagnosis not present

## 2016-10-15 DIAGNOSIS — I10 Essential (primary) hypertension: Secondary | ICD-10-CM | POA: Diagnosis not present

## 2016-10-15 DIAGNOSIS — M159 Polyosteoarthritis, unspecified: Secondary | ICD-10-CM | POA: Diagnosis not present

## 2016-10-15 DIAGNOSIS — E119 Type 2 diabetes mellitus without complications: Secondary | ICD-10-CM | POA: Diagnosis not present

## 2016-10-29 DIAGNOSIS — E1165 Type 2 diabetes mellitus with hyperglycemia: Secondary | ICD-10-CM | POA: Diagnosis not present

## 2016-10-29 DIAGNOSIS — Z6830 Body mass index (BMI) 30.0-30.9, adult: Secondary | ICD-10-CM | POA: Diagnosis not present

## 2016-10-29 DIAGNOSIS — I10 Essential (primary) hypertension: Secondary | ICD-10-CM | POA: Diagnosis not present

## 2016-10-29 DIAGNOSIS — Z299 Encounter for prophylactic measures, unspecified: Secondary | ICD-10-CM | POA: Diagnosis not present

## 2016-10-29 DIAGNOSIS — E7800 Pure hypercholesterolemia, unspecified: Secondary | ICD-10-CM | POA: Diagnosis not present

## 2016-11-26 DIAGNOSIS — I10 Essential (primary) hypertension: Secondary | ICD-10-CM | POA: Diagnosis not present

## 2016-11-26 DIAGNOSIS — E7800 Pure hypercholesterolemia, unspecified: Secondary | ICD-10-CM | POA: Diagnosis not present

## 2016-11-26 DIAGNOSIS — M545 Low back pain: Secondary | ICD-10-CM | POA: Diagnosis not present

## 2016-11-26 DIAGNOSIS — Z299 Encounter for prophylactic measures, unspecified: Secondary | ICD-10-CM | POA: Diagnosis not present

## 2016-11-26 DIAGNOSIS — Z6830 Body mass index (BMI) 30.0-30.9, adult: Secondary | ICD-10-CM | POA: Diagnosis not present

## 2016-11-26 DIAGNOSIS — E1165 Type 2 diabetes mellitus with hyperglycemia: Secondary | ICD-10-CM | POA: Diagnosis not present

## 2016-11-29 DIAGNOSIS — M159 Polyosteoarthritis, unspecified: Secondary | ICD-10-CM | POA: Diagnosis not present

## 2016-11-29 DIAGNOSIS — E119 Type 2 diabetes mellitus without complications: Secondary | ICD-10-CM | POA: Diagnosis not present

## 2016-11-29 DIAGNOSIS — I10 Essential (primary) hypertension: Secondary | ICD-10-CM | POA: Diagnosis not present

## 2017-01-04 DIAGNOSIS — I10 Essential (primary) hypertension: Secondary | ICD-10-CM | POA: Diagnosis not present

## 2017-01-04 DIAGNOSIS — M159 Polyosteoarthritis, unspecified: Secondary | ICD-10-CM | POA: Diagnosis not present

## 2017-01-04 DIAGNOSIS — E119 Type 2 diabetes mellitus without complications: Secondary | ICD-10-CM | POA: Diagnosis not present

## 2017-02-04 DIAGNOSIS — Z713 Dietary counseling and surveillance: Secondary | ICD-10-CM | POA: Diagnosis not present

## 2017-02-04 DIAGNOSIS — E1165 Type 2 diabetes mellitus with hyperglycemia: Secondary | ICD-10-CM | POA: Diagnosis not present

## 2017-02-04 DIAGNOSIS — Z299 Encounter for prophylactic measures, unspecified: Secondary | ICD-10-CM | POA: Diagnosis not present

## 2017-02-04 DIAGNOSIS — Z6829 Body mass index (BMI) 29.0-29.9, adult: Secondary | ICD-10-CM | POA: Diagnosis not present

## 2017-02-04 DIAGNOSIS — M545 Low back pain: Secondary | ICD-10-CM | POA: Diagnosis not present

## 2017-02-20 DIAGNOSIS — M159 Polyosteoarthritis, unspecified: Secondary | ICD-10-CM | POA: Diagnosis not present

## 2017-02-20 DIAGNOSIS — I10 Essential (primary) hypertension: Secondary | ICD-10-CM | POA: Diagnosis not present

## 2017-02-20 DIAGNOSIS — E119 Type 2 diabetes mellitus without complications: Secondary | ICD-10-CM | POA: Diagnosis not present

## 2017-03-19 DIAGNOSIS — E162 Hypoglycemia, unspecified: Secondary | ICD-10-CM | POA: Diagnosis not present

## 2017-03-19 DIAGNOSIS — R4182 Altered mental status, unspecified: Secondary | ICD-10-CM | POA: Diagnosis not present

## 2017-03-21 DIAGNOSIS — E119 Type 2 diabetes mellitus without complications: Secondary | ICD-10-CM | POA: Diagnosis not present

## 2017-03-21 DIAGNOSIS — I10 Essential (primary) hypertension: Secondary | ICD-10-CM | POA: Diagnosis not present

## 2017-03-21 DIAGNOSIS — M159 Polyosteoarthritis, unspecified: Secondary | ICD-10-CM | POA: Diagnosis not present

## 2017-04-19 DIAGNOSIS — E119 Type 2 diabetes mellitus without complications: Secondary | ICD-10-CM | POA: Diagnosis not present

## 2017-04-19 DIAGNOSIS — I10 Essential (primary) hypertension: Secondary | ICD-10-CM | POA: Diagnosis not present

## 2017-04-19 DIAGNOSIS — M159 Polyosteoarthritis, unspecified: Secondary | ICD-10-CM | POA: Diagnosis not present

## 2017-05-13 DIAGNOSIS — E7800 Pure hypercholesterolemia, unspecified: Secondary | ICD-10-CM | POA: Diagnosis not present

## 2017-05-13 DIAGNOSIS — Z299 Encounter for prophylactic measures, unspecified: Secondary | ICD-10-CM | POA: Diagnosis not present

## 2017-05-13 DIAGNOSIS — R350 Frequency of micturition: Secondary | ICD-10-CM | POA: Diagnosis not present

## 2017-05-13 DIAGNOSIS — E1165 Type 2 diabetes mellitus with hyperglycemia: Secondary | ICD-10-CM | POA: Diagnosis not present

## 2017-05-13 DIAGNOSIS — I10 Essential (primary) hypertension: Secondary | ICD-10-CM | POA: Diagnosis not present

## 2017-05-13 DIAGNOSIS — M545 Low back pain: Secondary | ICD-10-CM | POA: Diagnosis not present

## 2017-05-13 DIAGNOSIS — Z6828 Body mass index (BMI) 28.0-28.9, adult: Secondary | ICD-10-CM | POA: Diagnosis not present

## 2017-05-14 DIAGNOSIS — E119 Type 2 diabetes mellitus without complications: Secondary | ICD-10-CM | POA: Diagnosis not present

## 2017-05-14 DIAGNOSIS — I10 Essential (primary) hypertension: Secondary | ICD-10-CM | POA: Diagnosis not present

## 2017-05-14 DIAGNOSIS — M159 Polyosteoarthritis, unspecified: Secondary | ICD-10-CM | POA: Diagnosis not present

## 2017-06-26 ENCOUNTER — Encounter (INDEPENDENT_AMBULATORY_CARE_PROVIDER_SITE_OTHER): Payer: Self-pay | Admitting: *Deleted

## 2017-07-31 DIAGNOSIS — M419 Scoliosis, unspecified: Secondary | ICD-10-CM | POA: Diagnosis not present

## 2017-08-19 DIAGNOSIS — Z299 Encounter for prophylactic measures, unspecified: Secondary | ICD-10-CM | POA: Diagnosis not present

## 2017-08-19 DIAGNOSIS — M545 Low back pain: Secondary | ICD-10-CM | POA: Diagnosis not present

## 2017-08-19 DIAGNOSIS — Z6828 Body mass index (BMI) 28.0-28.9, adult: Secondary | ICD-10-CM | POA: Diagnosis not present

## 2017-08-19 DIAGNOSIS — E1165 Type 2 diabetes mellitus with hyperglycemia: Secondary | ICD-10-CM | POA: Diagnosis not present

## 2017-08-19 DIAGNOSIS — I10 Essential (primary) hypertension: Secondary | ICD-10-CM | POA: Diagnosis not present

## 2017-08-22 ENCOUNTER — Encounter (INDEPENDENT_AMBULATORY_CARE_PROVIDER_SITE_OTHER): Payer: Self-pay | Admitting: *Deleted

## 2017-08-23 ENCOUNTER — Other Ambulatory Visit (INDEPENDENT_AMBULATORY_CARE_PROVIDER_SITE_OTHER): Payer: Self-pay | Admitting: *Deleted

## 2017-08-23 DIAGNOSIS — Z86010 Personal history of colonic polyps: Secondary | ICD-10-CM | POA: Insufficient documentation

## 2017-09-12 ENCOUNTER — Other Ambulatory Visit (INDEPENDENT_AMBULATORY_CARE_PROVIDER_SITE_OTHER): Payer: Self-pay | Admitting: Otolaryngology

## 2017-09-12 DIAGNOSIS — E041 Nontoxic single thyroid nodule: Secondary | ICD-10-CM

## 2017-09-23 DIAGNOSIS — Z79899 Other long term (current) drug therapy: Secondary | ICD-10-CM | POA: Diagnosis not present

## 2017-09-23 DIAGNOSIS — Z299 Encounter for prophylactic measures, unspecified: Secondary | ICD-10-CM | POA: Diagnosis not present

## 2017-09-23 DIAGNOSIS — I10 Essential (primary) hypertension: Secondary | ICD-10-CM | POA: Diagnosis not present

## 2017-09-23 DIAGNOSIS — Z0000 Encounter for general adult medical examination without abnormal findings: Secondary | ICD-10-CM | POA: Diagnosis not present

## 2017-09-23 DIAGNOSIS — Z1331 Encounter for screening for depression: Secondary | ICD-10-CM | POA: Diagnosis not present

## 2017-09-23 DIAGNOSIS — E7800 Pure hypercholesterolemia, unspecified: Secondary | ICD-10-CM | POA: Diagnosis not present

## 2017-09-23 DIAGNOSIS — Z6828 Body mass index (BMI) 28.0-28.9, adult: Secondary | ICD-10-CM | POA: Diagnosis not present

## 2017-09-23 DIAGNOSIS — Z87891 Personal history of nicotine dependence: Secondary | ICD-10-CM | POA: Diagnosis not present

## 2017-09-23 DIAGNOSIS — Z1339 Encounter for screening examination for other mental health and behavioral disorders: Secondary | ICD-10-CM | POA: Diagnosis not present

## 2017-09-23 DIAGNOSIS — Z7189 Other specified counseling: Secondary | ICD-10-CM | POA: Diagnosis not present

## 2017-09-23 DIAGNOSIS — R5383 Other fatigue: Secondary | ICD-10-CM | POA: Diagnosis not present

## 2017-09-25 DIAGNOSIS — I10 Essential (primary) hypertension: Secondary | ICD-10-CM | POA: Diagnosis not present

## 2017-09-25 DIAGNOSIS — E119 Type 2 diabetes mellitus without complications: Secondary | ICD-10-CM | POA: Diagnosis not present

## 2017-09-25 DIAGNOSIS — M159 Polyosteoarthritis, unspecified: Secondary | ICD-10-CM | POA: Diagnosis not present

## 2017-09-26 ENCOUNTER — Ambulatory Visit (HOSPITAL_COMMUNITY)
Admission: RE | Admit: 2017-09-26 | Discharge: 2017-09-26 | Disposition: A | Discharge status: Home / Self Care | Payer: Medicare Other | Source: Ambulatory Visit | Attending: Otolaryngology | Admitting: Otolaryngology

## 2017-09-26 DIAGNOSIS — E041 Nontoxic single thyroid nodule: Secondary | ICD-10-CM | POA: Diagnosis not present

## 2017-10-03 ENCOUNTER — Ambulatory Visit (INDEPENDENT_AMBULATORY_CARE_PROVIDER_SITE_OTHER): Payer: Medicare Other | Admitting: Otolaryngology

## 2017-10-03 DIAGNOSIS — D440 Neoplasm of uncertain behavior of thyroid gland: Secondary | ICD-10-CM

## 2017-10-08 ENCOUNTER — Telehealth (INDEPENDENT_AMBULATORY_CARE_PROVIDER_SITE_OTHER): Payer: Self-pay | Admitting: *Deleted

## 2017-10-08 ENCOUNTER — Encounter (INDEPENDENT_AMBULATORY_CARE_PROVIDER_SITE_OTHER): Payer: Self-pay | Admitting: *Deleted

## 2017-10-08 NOTE — Telephone Encounter (Signed)
Patient needs suprep 

## 2017-10-09 MED ORDER — SUPREP BOWEL PREP KIT 17.5-3.13-1.6 GM/177ML PO SOLN: 1.0000 | Freq: Once | ORAL | 0 refills | Status: AC

## 2017-10-11 ENCOUNTER — Telehealth (INDEPENDENT_AMBULATORY_CARE_PROVIDER_SITE_OTHER): Payer: Self-pay | Admitting: *Deleted

## 2017-10-11 NOTE — Telephone Encounter (Signed)
agree

## 2017-10-11 NOTE — Telephone Encounter (Signed)
Referring MD/PCP: vyas   Procedure: tcs  Reason/Indication:  Hx polyps  Has patient had this procedure before?  Yes, 2013  If so, when, by whom and where?    Is there a family history of colon cancer?  no  Who?  What age when diagnosed?    Is patient diabetic?   yes      Does patient have prosthetic heart valve or mechanical valve?  no  Do you have a pacemaker?  no  Has patient ever had endocarditis? no  Has patient had joint replacement within last 12 months?  no  Is patient constipated or do they take laxatives? no  Does patient have a history of alcohol/drug use?  no  Is patient on blood thinner such as Coumadin, Plavix and/or Aspirin? no  Medications: rosuvastatin 2 mg bid, methocarbamol 500 mg bid, diclofenac 75 mg bid, metformin 500 mg bid, ramipril 5 mg daily  Allergies: nkda  Medication Adjustment per Dr Lindi Adie, NP: hold metformin evening before & morning of  Procedure date & time: 11/06/17 at 200

## 2017-10-21 DIAGNOSIS — E119 Type 2 diabetes mellitus without complications: Secondary | ICD-10-CM | POA: Diagnosis not present

## 2017-10-21 DIAGNOSIS — M159 Polyosteoarthritis, unspecified: Secondary | ICD-10-CM | POA: Diagnosis not present

## 2017-10-21 DIAGNOSIS — I10 Essential (primary) hypertension: Secondary | ICD-10-CM | POA: Diagnosis not present

## 2017-11-06 ENCOUNTER — Encounter (HOSPITAL_COMMUNITY): Payer: Self-pay

## 2017-11-06 ENCOUNTER — Other Ambulatory Visit: Payer: Self-pay

## 2017-11-06 ENCOUNTER — Ambulatory Visit (HOSPITAL_COMMUNITY)
Admission: RE | Admit: 2017-11-06 | Discharge: 2017-11-06 | Disposition: A | Discharge status: Home / Self Care | Payer: Medicare Other | Source: Ambulatory Visit | Attending: Internal Medicine | Admitting: Internal Medicine

## 2017-11-06 ENCOUNTER — Encounter (HOSPITAL_COMMUNITY)
Admission: RE | Disposition: A | Discharge status: Home / Self Care | Payer: Self-pay | Source: Ambulatory Visit | Attending: Internal Medicine

## 2017-11-06 DIAGNOSIS — I10 Essential (primary) hypertension: Secondary | ICD-10-CM | POA: Diagnosis not present

## 2017-11-06 DIAGNOSIS — Z791 Long term (current) use of non-steroidal anti-inflammatories (NSAID): Secondary | ICD-10-CM | POA: Diagnosis not present

## 2017-11-06 DIAGNOSIS — Z823 Family history of stroke: Secondary | ICD-10-CM | POA: Diagnosis not present

## 2017-11-06 DIAGNOSIS — D125 Benign neoplasm of sigmoid colon: Secondary | ICD-10-CM | POA: Diagnosis not present

## 2017-11-06 DIAGNOSIS — D214 Benign neoplasm of connective and other soft tissue of abdomen: Secondary | ICD-10-CM | POA: Diagnosis not present

## 2017-11-06 DIAGNOSIS — Z79899 Other long term (current) drug therapy: Secondary | ICD-10-CM | POA: Diagnosis not present

## 2017-11-06 DIAGNOSIS — Z1211 Encounter for screening for malignant neoplasm of colon: Secondary | ICD-10-CM | POA: Diagnosis not present

## 2017-11-06 DIAGNOSIS — D126 Benign neoplasm of colon, unspecified: Secondary | ICD-10-CM | POA: Insufficient documentation

## 2017-11-06 DIAGNOSIS — E785 Hyperlipidemia, unspecified: Secondary | ICD-10-CM | POA: Diagnosis not present

## 2017-11-06 DIAGNOSIS — Z87891 Personal history of nicotine dependence: Secondary | ICD-10-CM | POA: Insufficient documentation

## 2017-11-06 DIAGNOSIS — K5730 Diverticulosis of large intestine without perforation or abscess without bleeding: Secondary | ICD-10-CM | POA: Insufficient documentation

## 2017-11-06 DIAGNOSIS — K648 Other hemorrhoids: Secondary | ICD-10-CM | POA: Insufficient documentation

## 2017-11-06 DIAGNOSIS — E1140 Type 2 diabetes mellitus with diabetic neuropathy, unspecified: Secondary | ICD-10-CM | POA: Insufficient documentation

## 2017-11-06 DIAGNOSIS — D123 Benign neoplasm of transverse colon: Secondary | ICD-10-CM | POA: Diagnosis not present

## 2017-11-06 DIAGNOSIS — K219 Gastro-esophageal reflux disease without esophagitis: Secondary | ICD-10-CM | POA: Insufficient documentation

## 2017-11-06 DIAGNOSIS — Z86010 Personal history of colonic polyps: Secondary | ICD-10-CM | POA: Insufficient documentation

## 2017-11-06 DIAGNOSIS — I341 Nonrheumatic mitral (valve) prolapse: Secondary | ICD-10-CM | POA: Diagnosis not present

## 2017-11-06 DIAGNOSIS — Z09 Encounter for follow-up examination after completed treatment for conditions other than malignant neoplasm: Secondary | ICD-10-CM | POA: Diagnosis not present

## 2017-11-06 HISTORY — PX: COLONOSCOPY: SHX5424

## 2017-11-06 HISTORY — PX: POLYPECTOMY: SHX5525

## 2017-11-06 LAB — GLUCOSE, CAPILLARY: GLUCOSE-CAPILLARY: 175 mg/dL — AB (ref 70–99)

## 2017-11-06 SURGERY — COLONOSCOPY
Anesthesia: Moderate Sedation

## 2017-11-06 MED ORDER — MIDAZOLAM HCL 5 MG/5ML IJ SOLN
INTRAMUSCULAR | Status: AC
  Filled 2017-11-06: qty 10

## 2017-11-06 MED ORDER — STERILE WATER FOR IRRIGATION IR SOLN
Status: DC | PRN
  Administered 2017-11-06: 10:00:00

## 2017-11-06 MED ORDER — MEPERIDINE HCL 50 MG/ML IJ SOLN
INTRAMUSCULAR | Status: DC | PRN
  Administered 2017-11-06 (×2): 25 mg via INTRAVENOUS

## 2017-11-06 MED ORDER — SODIUM CHLORIDE 0.9 % IV SOLN
INTRAVENOUS | Status: DC
  Administered 2017-11-06: 09:00:00 via INTRAVENOUS

## 2017-11-06 MED ORDER — MIDAZOLAM HCL 5 MG/5ML IJ SOLN
INTRAMUSCULAR | Status: DC | PRN
  Administered 2017-11-06: 2 mg via INTRAVENOUS
  Administered 2017-11-06: 1 mg via INTRAVENOUS
  Administered 2017-11-06: 2 mg via INTRAVENOUS

## 2017-11-06 MED ORDER — MEPERIDINE HCL 50 MG/ML IJ SOLN
INTRAMUSCULAR | Status: AC
  Filled 2017-11-06: qty 1

## 2017-11-06 NOTE — Discharge Instructions (Signed)
No aspirin or NSAIDs for 24 hours. Resume other medications as before. Modified carb, high-fiber diet. No driving for 24 hours. Physician will call with biopsy results.   Colonoscopy, Adult, Care After This sheet gives you information about how to care for yourself after your procedure. Your health care provider may also give you more specific instructions. If you have problems or questions, contact your health care provider. What can I expect after the procedure? After the procedure, it is common to have:  A small amount of blood in your stool for 24 hours after the procedure.  Some gas.  Mild abdominal cramping or bloating.  Follow these instructions at home: General instructions   For the first 24 hours after the procedure: ? Do not drive or use machinery. ? Do not sign important documents. ? Do not drink alcohol. ? Do your regular daily activities at a slower pace than normal. ? Eat soft, easy-to-digest foods. ? Rest often.  Take over-the-counter or prescription medicines only as told by your health care provider.  It is up to you to get the results of your procedure. Ask your health care provider, or the department performing the procedure, when your results will be ready. Relieving cramping and bloating  Try walking around when you have cramps or feel bloated.  Apply heat to your abdomen as told by your health care provider. Use a heat source that your health care provider recommends, such as a moist heat pack or a heating pad. ? Place a towel between your skin and the heat source. ? Leave the heat on for 20-30 minutes. ? Remove the heat if your skin turns bright red. This is especially important if you are unable to feel pain, heat, or cold. You may have a greater risk of getting burned. Eating and drinking  Drink enough fluid to keep your urine clear or pale yellow.  Resume your normal diet as instructed by your health care provider. Avoid heavy or fried foods  that are hard to digest.  Avoid drinking alcohol for as long as instructed by your health care provider. Contact a health care provider if:  You have blood in your stool 2-3 days after the procedure. Get help right away if:  You have more than a small spotting of blood in your stool.  You pass large blood clots in your stool.  Your abdomen is swollen.  You have nausea or vomiting.  You have a fever.  You have increasing abdominal pain that is not relieved with medicine. This information is not intended to replace advice given to you by your health care provider. Make sure you discuss any questions you have with your health care provider. Document Released: 10/04/2003 Document Revised: 11/14/2015 Document Reviewed: 05/03/2015 Elsevier Interactive Patient Education  2018 [employer].  Hemorrhoids Hemorrhoids are swollen veins in and around the rectum or anus. There are two types of hemorrhoids:  Internal hemorrhoids. These occur in the veins that are just inside the rectum. They may poke through to the outside and become irritated and painful.  External hemorrhoids. These occur in the veins that are outside of the anus and can be felt as a painful swelling or hard lump near the anus.  Most hemorrhoids do not cause serious problems, and they can be managed with home treatments such as diet and lifestyle changes. If home treatments do not help your symptoms, procedures can be done to shrink or remove the hemorrhoids. What are the causes? This condition is caused  by increased pressure in the anal area. This pressure may result from various things, including:  Constipation.  Straining to have a bowel movement.  Diarrhea.  Pregnancy.  Obesity.  Sitting for long periods of time.  Heavy lifting or other activity that causes you to strain.  Anal sex.  What are the signs or symptoms? Symptoms of this condition include:  Pain.  Anal itching or irritation.  Rectal  bleeding.  Leakage of stool (feces).  Anal swelling.  One or more lumps around the anus.  How is this diagnosed? This condition can often be diagnosed through a visual exam. Other exams or tests may also be done, such as:  Examination of the rectal area with a gloved hand (digital rectal exam).  Examination of the anal canal using a small tube (anoscope).  A blood test, if you have lost a significant amount of blood.  A test to look inside the colon (sigmoidoscopy or colonoscopy).  How is this treated? This condition can usually be treated at home. However, various procedures may be done if dietary changes, lifestyle changes, and other home treatments do not help your symptoms. These procedures can help make the hemorrhoids smaller or remove them completely. Some of these procedures involve surgery, and others do not. Common procedures include:  Rubber band ligation. Rubber bands are placed at the base of the hemorrhoids to cut off the blood supply to them.  Sclerotherapy. Medicine is injected into the hemorrhoids to shrink them.  Infrared coagulation. A type of light energy is used to get rid of the hemorrhoids.  Hemorrhoidectomy surgery. The hemorrhoids are surgically removed, and the veins that supply them are tied off.  Stapled hemorrhoidopexy surgery. A circular stapling device is used to remove the hemorrhoids and use staples to cut off the blood supply to them.  Follow these instructions at home: Eating and drinking  Eat foods that have a lot of fiber in them, such as whole grains, beans, nuts, fruits, and vegetables. Ask your health care provider about taking products that have added fiber (fiber supplements).  Drink enough fluid to keep your urine clear or pale yellow. Managing pain and swelling  Take warm sitz baths for 20 minutes, 3-4 times a day to ease pain and discomfort.  If directed, apply ice to the affected area. Using ice packs between sitz baths may be  helpful. ? Put ice in a plastic bag. ? Place a towel between your skin and the bag. ? Leave the ice on for 20 minutes, 2-3 times a day. General instructions  Take over-the-counter and prescription medicines only as told by your health care provider.  Use medicated creams or suppositories as told.  Exercise regularly.  Go to the bathroom when you have the urge to have a bowel movement. Do not wait.  Avoid straining to have bowel movements.  Keep the anal area dry and clean. Use wet toilet paper or moist towelettes after a bowel movement.  Do not sit on the toilet for long periods of time. This increases blood pooling and pain. Contact a health care provider if:  You have increasing pain and swelling that are not controlled by treatment or medicine.  You have uncontrolled bleeding.  You have difficulty having a bowel movement, or you are unable to have a bowel movement.  You have pain or inflammation outside the area of the hemorrhoids. This information is not intended to replace advice given to you by your health care provider. Make sure you discuss  any questions you have with your health care provider. Document Released: 02/17/2000 Document Revised: 07/20/2015 Document Reviewed: 11/03/2014 Elsevier Interactive Patient Education  2018 [employer].  Colon Polyps Polyps are tissue growths inside the body. Polyps can grow in many places, including the large intestine (colon). A polyp may be a round bump or a mushroom-shaped growth. You could have one polyp or several. Most colon polyps are noncancerous (benign). However, some colon polyps can become cancerous over time. What are the causes? The exact cause of colon polyps is not known. What increases the risk? This condition is more likely to develop in people who:  Have a family history of colon cancer or colon polyps.  Are older than 3 or older than 45 if they are African American.  Have inflammatory bowel disease, such  as ulcerative colitis or Crohn disease.  Are overweight.  Smoke cigarettes.  Do not get enough exercise.  Drink too much alcohol.  Eat a diet that is: ? High in fat and red meat. ? Low in fiber.  Had childhood cancer that was treated with abdominal radiation.  What are the signs or symptoms? Most polyps do not cause symptoms. If you have symptoms, they may include:  Blood coming from your rectum when having a bowel movement.  Blood in your stool.The stool may look dark red or black.  A change in bowel habits, such as constipation or diarrhea.  How is this diagnosed? This condition is diagnosed with a colonoscopy. This is a procedure that uses a lighted, flexible scope to look at the inside of your colon. How is this treated? Treatment for this condition involves removing any polyps that are found. Those polyps will then be tested for cancer. If cancer is found, your health care provider will talk to you about options for colon cancer treatment. Follow these instructions at home: Diet  Eat plenty of fiber, such as fruits, vegetables, and whole grains.  Eat foods that are high in calcium and vitamin D, such as milk, cheese, yogurt, eggs, liver, fish, and broccoli.  Limit foods high in fat, red meats, and processed meats, such as hot dogs, sausage, bacon, and lunch meats.  Maintain a healthy weight, or lose weight if recommended by your health care provider. General instructions  Do not smoke cigarettes.  Do not drink alcohol excessively.  Keep all follow-up visits as told by your health care provider. This is important. This includes keeping regularly scheduled colonoscopies. Talk to your health care provider about when you need a colonoscopy.  Exercise every day or as told by your health care provider. Contact a health care provider if:  You have new or worsening bleeding during a bowel movement.  You have new or increased blood in your stool.  You have a change  in bowel habits.  You unexpectedly lose weight. This information is not intended to replace advice given to you by your health care provider. Make sure you discuss any questions you have with your health care provider. Document Released: 11/16/2003 Document Revised: 07/28/2015 Document Reviewed: 01/10/2015 Elsevier Interactive Patient Education  [employer].

## 2017-11-06 NOTE — H&P (Signed)
Michelle Case is an 67 y.o. female.   Chief Complaint: patient is here for colonoscopy. HPI: Patient is 67 year old African-American female with history of colonic adenomas and is here for surveillance colonoscopy.  She denies rectal bleeding or abdominal pain.  She states she has noted a change in her bowel habits.  However she does not have a good appetite and has been losing weight.  She says her weight loss started 5 years ago when she lost her husband and she just has not fully recovered.  She sees Dr. Woody Seller every 3 months.  Last colonoscopy was in 2013 and she did not have any adenomas.  She had adenomas on prior colonoscopies. Family history is negative for CRC.  Past Medical History:  Diagnosis Date  . Cervical pain (neck)   . Degenerative disk disease   . Degenerative joint disease   . Diabetes mellitus   . GERD (gastroesophageal reflux disease)   . Hyperlipidemia    "managing with diet"  . Hypertension   . Mitral valve prolapse    states hx of  . Neuropathy   . Thyroid cyst     Past Surgical History:  Procedure Laterality Date  . ABDOMINAL HYSTERECTOMY    . ANTERIOR CERVICAL CORPECTOMY  01/08/2012   Procedure: ANTERIOR CERVICAL CORPECTOMY;  Surgeon: Kristeen Miss, MD;  Location: [facility];  Service: Neurosurgery;  Laterality: N/A;  Cervical five-six Corpectomy, Cervical four-seven Arthrodesis  . colonscopy     polyp- cancer in situ  . HAND SURGERY     right hand  . HAND SURGERY     right    Family History  Problem Relation Age of Onset  . Stroke Mother   . Stroke Father   . Stroke Sister   . Stroke Brother   . Heart disease Brother    Social History:  reports that she has quit smoking. She has never used smokeless tobacco. She reports that she does not drink alcohol or use drugs.  Allergies: No Known Allergies  Medications Prior to Admission  Medication Sig Dispense Refill  . Coenzyme Q10 100 MG TABS Take 500 mg by mouth daily.    . diclofenac  (VOLTAREN) 75 MG EC tablet Take 75 mg by mouth 2 (two) times daily.    . diclofenac sodium (VOLTAREN) 1 % GEL Apply 2 g topically daily as needed (muscle soreness).    Marland Kitchen glyBURIDE-metformin (GLUCOVANCE) 5-500 MG per tablet Take 1 tablet by mouth daily with breakfast.     . methocarbamol (ROBAXIN) 500 MG tablet Take 500 mg by mouth 2 (two) times daily.    . ramipril (ALTACE) 5 MG capsule Take 5 mg by mouth daily.    . diazepam (VALIUM) 5 MG tablet Take 1 tablet (5 mg total) by mouth every 6 (six) hours as needed (Muscle spasm). (Patient not taking: Reported on 10/29/2017) 30 tablet 0  . omeprazole (PRILOSEC) 20 MG capsule Take 1 capsule (20 mg total) by mouth daily. (Patient not taking: Reported on 10/29/2017) 90 capsule 3  . oxyCODONE-acetaminophen (PERCOCET/ROXICET) 5-325 MG per tablet Take 1-2 tablets by mouth every 4 (four) hours as needed for pain. (Patient not taking: Reported on 10/29/2017) 30 tablet 0    Results for orders placed or performed during the hospital encounter of 11/06/17 (from the past 48 hour(s))  Glucose, capillary     Status: Abnormal   Collection Time: 11/06/17  9:15 AM  Result Value Ref Range   Glucose-Capillary 175 (H) 70 - 99 mg/dL  No results found.  ROS  Blood pressure 138/72, pulse 74, temperature 98.6 F (37 C), temperature source Oral, resp. rate 18, height 4\' 11"  (1.499 m), weight 65.8 kg, SpO2 99 %. Physical Exam  Constitutional: She appears well-developed and well-nourished.  HENT:  Mouth/Throat: Oropharynx is clear and moist.  Eyes: Conjunctivae are normal. No scleral icterus.  Neck: No thyromegaly present.  Cardiovascular: Normal rate, regular rhythm and normal heart sounds.  No murmur heard. Respiratory: Effort normal and breath sounds normal.  GI: Soft. She exhibits no distension and no mass. There is no tenderness.  Musculoskeletal: She exhibits no edema.  Lymphadenopathy:    She has no cervical adenopathy.  Neurological: She is alert.  Skin:  Skin is warm and dry.  Multiple/numerous moles over her face as well as abdomen.     Assessment/Plan History of colonic adenomas Surveillance colonoscopy.  Hildred Laser, MD 11/06/2017, 9:35 AM

## 2017-11-06 NOTE — Op Note (Signed)
[facility] Patient Name: Michelle Case Procedure Date: 11/06/2017 8:56 AM MRN: [id number] Date of Birth: [birth year] Attending MD: Hildred Laser , MD CSN: [id number] Age: 67 Admit Type: Outpatient Procedure:                Colonoscopy Indications:              High risk colon cancer surveillance: Personal                            history of colonic polyps Providers:                Hildred Laser, MD, Otis Peak B. Sharon Seller, RN, Randa Spike, Technician Referring MD:             Glenda Chroman, MD Medicines:                Meperidine 50 mg IV, Midazolam 5 mg IV Complications:            No immediate complications. Estimated Blood Loss:     Estimated blood loss was minimal. Procedure:                Pre-Anesthesia Assessment:                           - Prior to the procedure, a History and Physical                            was performed, and patient medications and                            allergies were reviewed. The patient's tolerance of                            previous anesthesia was also reviewed. The risks                            and benefits of the procedure and the sedation                            options and risks were discussed with the patient.                            All questions were answered, and informed consent                            was obtained. Prior Anticoagulants: The patient                            last took previous NSAID medication 1 day prior to                            the procedure. ASA Grade Assessment: III - A  patient with severe systemic disease. After                            reviewing the risks and benefits, the patient was                            deemed in satisfactory condition to undergo the                            procedure.                           After obtaining informed consent, the colonoscope                            was passed under direct  vision. Throughout the                            procedure, the patient's blood pressure, pulse, and                            oxygen saturations were monitored continuously. The                            [id number]-[id number] ([id number]) scope was introduced through                            the anus and advanced to the the cecum, identified                            by appendiceal orifice and ileocecal valve. The                            colonoscopy was somewhat difficult due to a                            tortuous colon. Successful completion of the                            procedure was aided by changing the patient's                            position. The patient tolerated the procedure well.                            The quality of the bowel preparation was good. The                            ileocecal valve, appendiceal orifice, and rectum                            were photographed. Scope In: 9:47:20 AM Scope Out: 10:16:40 AM Scope Withdrawal Time: 0 hours 15 minutes 47 seconds  Total Procedure Duration: 0 hours 29 minutes 20 seconds  Findings:      The perianal and digital rectal examinations were normal.      Two sessile polyps were found in the sigmoid colon and hepatic flexure.       The polyps were small in size. These were biopsied with a cold forceps       for histology. The pathology specimen was placed into Bottle Number 1.      A small polyp was found in the sigmoid colon. The polyp was sessile. The       polyp was removed with a cold snare. Resection and retrieval were       complete. The pathology specimen was placed into Bottle Number 1.      A few small-mouthed diverticula were found in the sigmoid colon and       hepatic flexure.      Internal hemorrhoids were found during retroflexion. The hemorrhoids       were small. Impression:               - Two small polyps in the sigmoid colon and at the                            hepatic flexure. Biopsied.                            - One small polyp in the sigmoid colon, removed                            with a cold snare. Resected and retrieved.                           - Diverticulosis in the sigmoid colon and at the                            hepatic flexure.                           - Internal hemorrhoids. Moderate Sedation:      Moderate (conscious) sedation was administered by the endoscopy nurse       and supervised by the endoscopist. The following parameters were       monitored: oxygen saturation, heart rate, blood pressure, CO2       capnography and response to care. Total physician intraservice time was       31 minutes. Recommendation:           - Patient has a contact number available for                            emergencies. The signs and symptoms of potential                            delayed complications were discussed with the                            patient. Return to normal activities tomorrow.                            Written discharge  instructions were provided to the                            patient.                           - High fiber diet and diabetic (ADA) diet today.                           - Continue present medications.                           - No aspirin, ibuprofen, naproxen, or other                            non-steroidal anti-inflammatory drugs for 1 day.                           - Await pathology results.                           - Repeat colonoscopy is recommended for                            surveillance. The colonoscopy date will be                            determined after pathology results from today's                            exam become available for review. Procedure Code(s):        --- Professional ---                           [phone], Colonoscopy, flexible; with removal of                            tumor(s), polyp(s), or other lesion(s) by snare                            technique                           [id number], 59,  Colonoscopy, flexible; with biopsy,                            single or multiple                           G0500, Moderate sedation services provided by the                            same physician or other qualified health care                            professional performing a gastrointestinal  endoscopic service that sedation supports,                            requiring the presence of an independent trained                            observer to assist in the monitoring of the                            patient's level of consciousness and physiological                            status; initial 15 minutes of intra-service time;                            patient age 14 years or older (additional time may                            be reported with [phone], as appropriate)                           [phone], Moderate sedation services provided by the                            same physician or other qualified health care                            professional performing the diagnostic or                            therapeutic service that the sedation supports,                            requiring the presence of an independent trained                            observer to assist in the monitoring of the                            patient's level of consciousness and physiological                            status; each additional 15 minutes intraservice                            time (List separately in addition to code for                            primary service) Diagnosis Code(s):        --- Professional ---                           Z86.010, Personal history of colonic polyps  D12.5, Benign neoplasm of sigmoid colon                           D12.3, Benign neoplasm of transverse colon (hepatic                            flexure or splenic flexure)                           K64.8, Other hemorrhoids                            K57.30, Diverticulosis of large intestine without                            perforation or abscess without bleeding CPT copyright 2017 [employer]. All rights reserved. The codes documented in this report are preliminary and upon coder review may  be revised to meet current compliance requirements. Hildred Laser, MD Hildred Laser, MD 11/06/2017 10:26:13 AM This report has been signed electronically. Number of Addenda: 0

## 2017-11-10 IMAGING — US US THYROID
1 series · 13 of 25 positions shown · non-contrast
Comparison: 08/31/2015

CLINICAL DATA: Prior ultrasound follow-up.  Follow-up nodule.

EXAM:
THYROID ULTRASOUND
TECHNIQUE: Ultrasound examination of the thyroid gland and adjacent soft
tissues was performed.

[Series 1: us thyroid · 0.07mm/px · 13 of 62 slices shown]
[im 1/62]
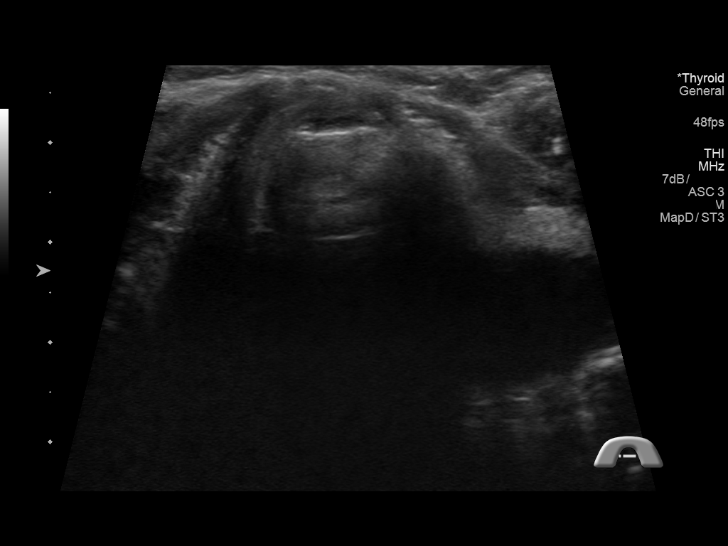
[im 6/62]
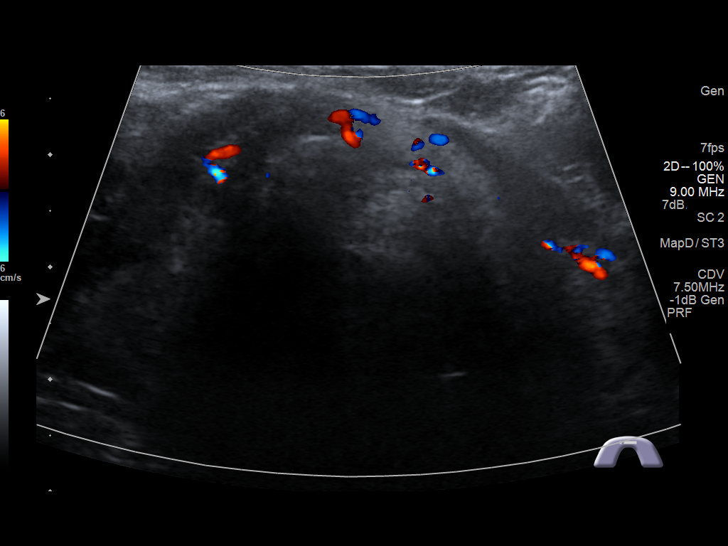
[im 11/62]
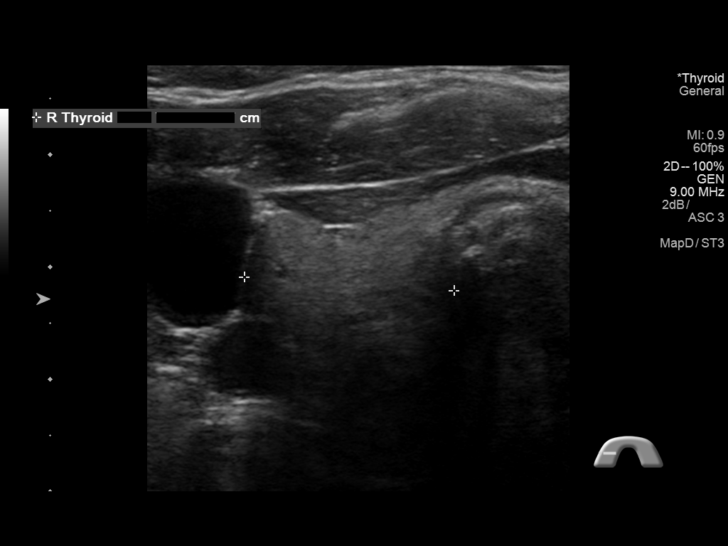
[im 16/62]
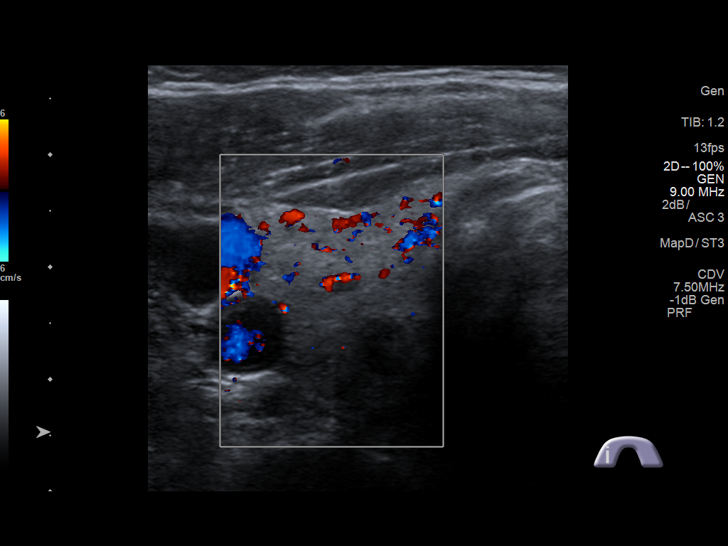
[im 21/62]
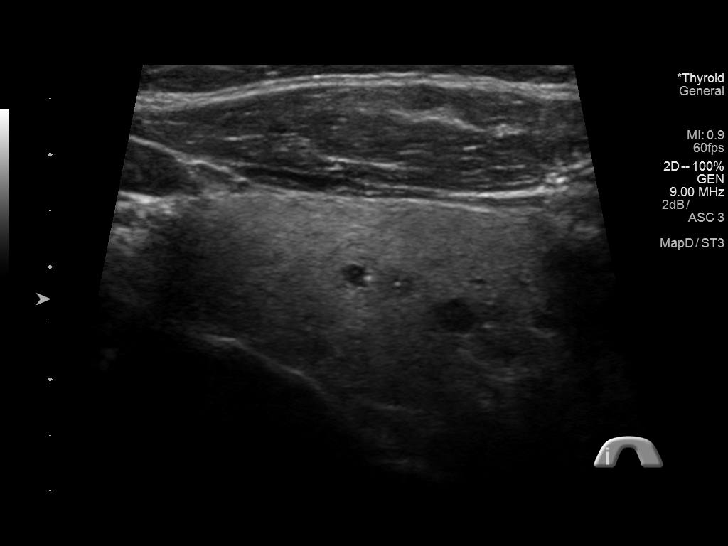
[im 26/62]
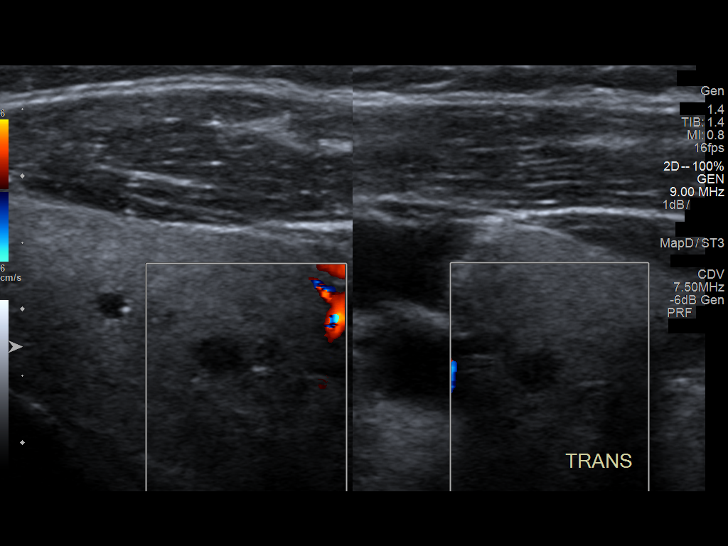
[im 31/62]
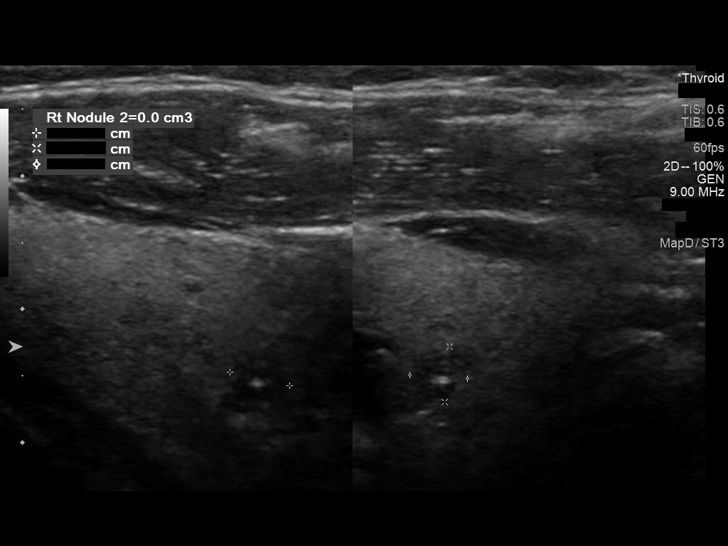
[im 36/62]
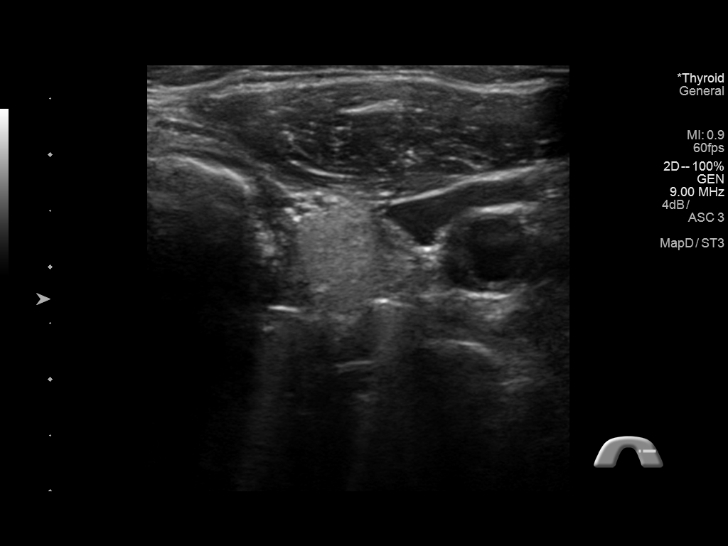
[im 41/62]
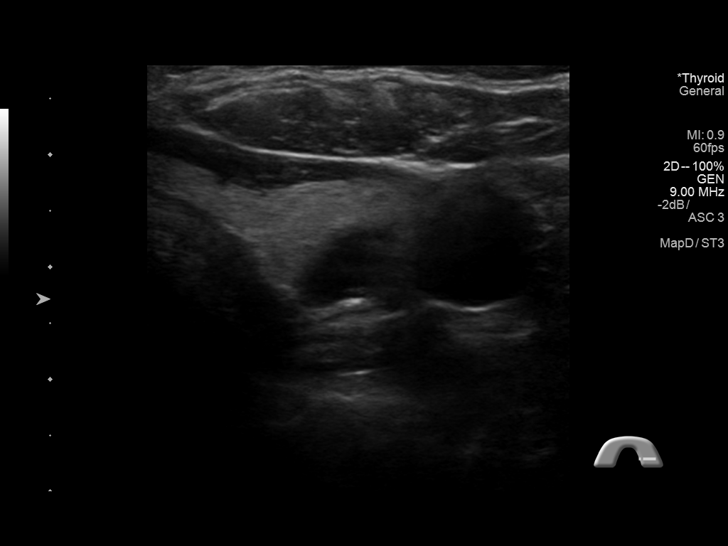
[im 46/62]
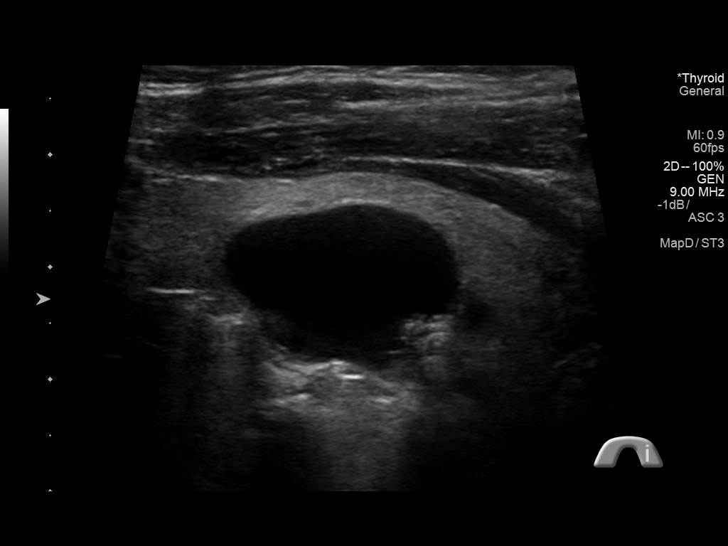
[im 51/62]
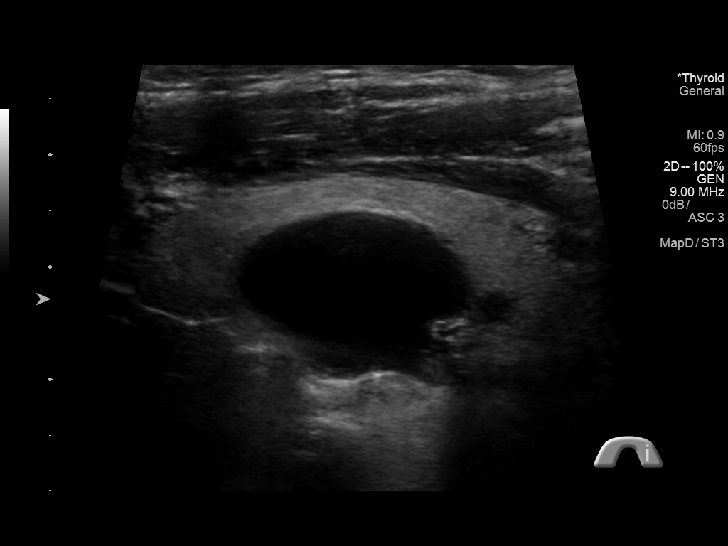
[im 56/62]
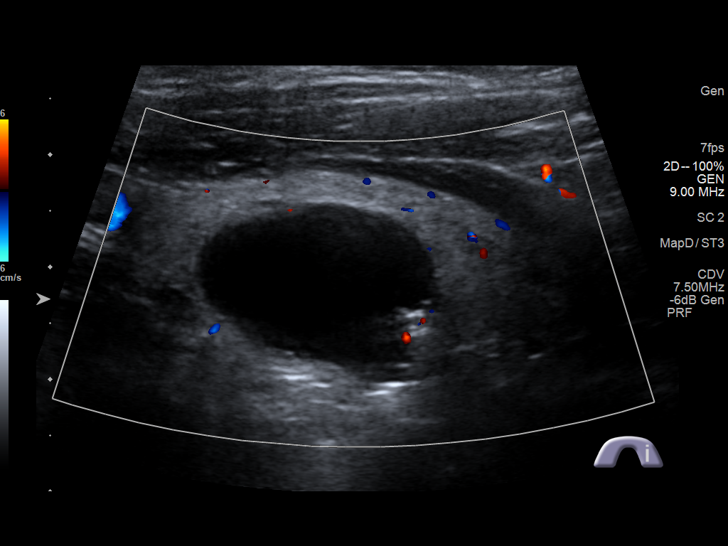
[im 62/62]
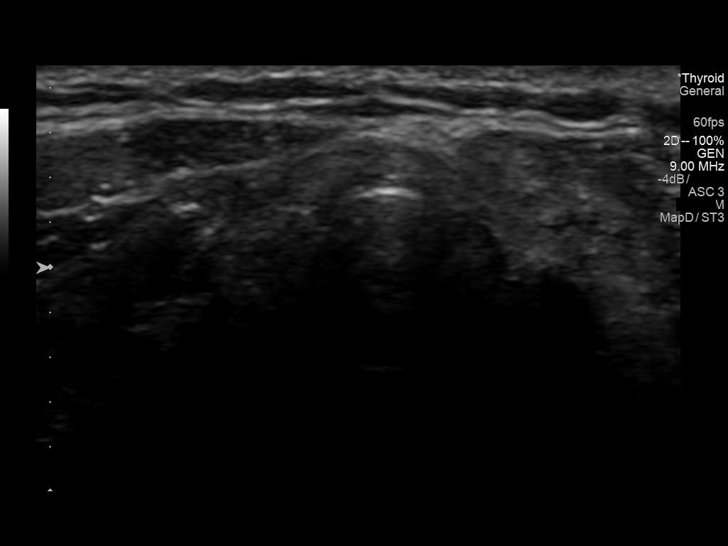

[13 of 25 positions shown; findings below may reference images not displayed]

FINDINGS: Parenchymal Echotexture: Mildly heterogenous

Isthmus: 0.2 cm, previously 0.4 cm

Right lobe: 4.2 x 2.0 x 1.9 cm, previously 3.9 x 2.0 x 1.6 cm

Left lobe: 4.6 x 2.2 x 1.9 cm, previously 4.6 x 2.2 x 1.7 cm

_________________________________________________________

Estimated total number of nodules >/= 1 cm: 1

Number of spongiform nodules >/=  2 cm not described below (TR1): 0

Number of mixed cystic and solid nodules >/= 1.5 cm not described
below (TR2): 0

_________________________________________________________

Nodule # 1:

Prior biopsy: No

Location: Left; Inferior

Maximum size: 2.3 cm; Other 2 dimensions: 1.9 x 1.9 cm, previously,
2.0 x 1.9 x 1.5 cm

Composition: mixed cystic and solid (1)

Echogenicity: hyperechoic (1)

Shape: not taller-than-wide (0)

Margins: smooth (0)

Echogenic foci: none (0)

ACR TI-RADS total points: 2.

ACR TI-RADS risk category:  TR2 (2 points).

Significant change in size (>/= 20% in two dimensions and minimal
increase of 2 mm): No

Change in features: No

Change in ACR TI-RADS risk category: No

ACR TI-RADS recommendations:

This nodule does NOT meet TI-RADS criteria for biopsy or dedicated
follow-up. This nodule does NOT meet TI-RADS criteria for biopsy or
dedicated follow-up.

_________________________________________________________

There are small nodules in the right lower pole measuring 0.4 cm or
less in size.

_________________________________________________________
IMPRESSION: Predominately cystic nodule in the left lower pole is stable and
does not meet criteria for follow-up or biopsy.

The above is in keeping with the ACR TI-RADS recommendations - [HOSPITAL] 0510;[DATE].

## 2017-11-11 ENCOUNTER — Encounter (HOSPITAL_COMMUNITY): Payer: Self-pay | Admitting: Internal Medicine

## 2017-11-19 DIAGNOSIS — M159 Polyosteoarthritis, unspecified: Secondary | ICD-10-CM | POA: Diagnosis not present

## 2017-11-19 DIAGNOSIS — I10 Essential (primary) hypertension: Secondary | ICD-10-CM | POA: Diagnosis not present

## 2017-11-19 DIAGNOSIS — E119 Type 2 diabetes mellitus without complications: Secondary | ICD-10-CM | POA: Diagnosis not present

## 2017-12-11 DIAGNOSIS — Z6827 Body mass index (BMI) 27.0-27.9, adult: Secondary | ICD-10-CM | POA: Diagnosis not present

## 2017-12-11 DIAGNOSIS — I10 Essential (primary) hypertension: Secondary | ICD-10-CM | POA: Diagnosis not present

## 2017-12-11 DIAGNOSIS — E1165 Type 2 diabetes mellitus with hyperglycemia: Secondary | ICD-10-CM | POA: Diagnosis not present

## 2017-12-11 DIAGNOSIS — Z299 Encounter for prophylactic measures, unspecified: Secondary | ICD-10-CM | POA: Diagnosis not present

## 2017-12-11 DIAGNOSIS — R200 Anesthesia of skin: Secondary | ICD-10-CM | POA: Diagnosis not present

## 2017-12-11 DIAGNOSIS — M545 Low back pain: Secondary | ICD-10-CM | POA: Diagnosis not present

## 2017-12-19 DIAGNOSIS — E119 Type 2 diabetes mellitus without complications: Secondary | ICD-10-CM | POA: Diagnosis not present

## 2017-12-19 DIAGNOSIS — I10 Essential (primary) hypertension: Secondary | ICD-10-CM | POA: Diagnosis not present

## 2017-12-19 DIAGNOSIS — M159 Polyosteoarthritis, unspecified: Secondary | ICD-10-CM | POA: Diagnosis not present

## 2018-01-17 DIAGNOSIS — I10 Essential (primary) hypertension: Secondary | ICD-10-CM | POA: Diagnosis not present

## 2018-01-17 DIAGNOSIS — M159 Polyosteoarthritis, unspecified: Secondary | ICD-10-CM | POA: Diagnosis not present

## 2018-01-17 DIAGNOSIS — E119 Type 2 diabetes mellitus without complications: Secondary | ICD-10-CM | POA: Diagnosis not present

## 2018-02-12 DIAGNOSIS — I10 Essential (primary) hypertension: Secondary | ICD-10-CM | POA: Diagnosis not present

## 2018-02-12 DIAGNOSIS — E119 Type 2 diabetes mellitus without complications: Secondary | ICD-10-CM | POA: Diagnosis not present

## 2018-02-12 DIAGNOSIS — M159 Polyosteoarthritis, unspecified: Secondary | ICD-10-CM | POA: Diagnosis not present

## 2018-03-18 DIAGNOSIS — M159 Polyosteoarthritis, unspecified: Secondary | ICD-10-CM | POA: Diagnosis not present

## 2018-03-18 DIAGNOSIS — E119 Type 2 diabetes mellitus without complications: Secondary | ICD-10-CM | POA: Diagnosis not present

## 2018-03-18 DIAGNOSIS — I10 Essential (primary) hypertension: Secondary | ICD-10-CM | POA: Diagnosis not present

## 2018-03-20 DIAGNOSIS — E1165 Type 2 diabetes mellitus with hyperglycemia: Secondary | ICD-10-CM | POA: Diagnosis not present

## 2018-03-20 DIAGNOSIS — E7800 Pure hypercholesterolemia, unspecified: Secondary | ICD-10-CM | POA: Diagnosis not present

## 2018-03-20 DIAGNOSIS — Z6827 Body mass index (BMI) 27.0-27.9, adult: Secondary | ICD-10-CM | POA: Diagnosis not present

## 2018-03-20 DIAGNOSIS — M545 Low back pain: Secondary | ICD-10-CM | POA: Diagnosis not present

## 2018-03-20 DIAGNOSIS — I10 Essential (primary) hypertension: Secondary | ICD-10-CM | POA: Diagnosis not present

## 2018-03-20 DIAGNOSIS — Z299 Encounter for prophylactic measures, unspecified: Secondary | ICD-10-CM | POA: Diagnosis not present

## 2018-05-30 DIAGNOSIS — M159 Polyosteoarthritis, unspecified: Secondary | ICD-10-CM | POA: Diagnosis not present

## 2018-05-30 DIAGNOSIS — E119 Type 2 diabetes mellitus without complications: Secondary | ICD-10-CM | POA: Diagnosis not present

## 2018-05-30 DIAGNOSIS — I10 Essential (primary) hypertension: Secondary | ICD-10-CM | POA: Diagnosis not present

## 2018-06-25 DIAGNOSIS — Z6826 Body mass index (BMI) 26.0-26.9, adult: Secondary | ICD-10-CM | POA: Diagnosis not present

## 2018-06-25 DIAGNOSIS — I10 Essential (primary) hypertension: Secondary | ICD-10-CM | POA: Diagnosis not present

## 2018-06-25 DIAGNOSIS — M545 Low back pain: Secondary | ICD-10-CM | POA: Diagnosis not present

## 2018-06-25 DIAGNOSIS — E1165 Type 2 diabetes mellitus with hyperglycemia: Secondary | ICD-10-CM | POA: Diagnosis not present

## 2018-06-25 DIAGNOSIS — Z299 Encounter for prophylactic measures, unspecified: Secondary | ICD-10-CM | POA: Diagnosis not present

## 2018-06-27 DIAGNOSIS — M159 Polyosteoarthritis, unspecified: Secondary | ICD-10-CM | POA: Diagnosis not present

## 2018-06-27 DIAGNOSIS — E119 Type 2 diabetes mellitus without complications: Secondary | ICD-10-CM | POA: Diagnosis not present

## 2018-06-27 DIAGNOSIS — I10 Essential (primary) hypertension: Secondary | ICD-10-CM | POA: Diagnosis not present

## 2018-07-02 DIAGNOSIS — F419 Anxiety disorder, unspecified: Secondary | ICD-10-CM | POA: Diagnosis not present

## 2018-07-09 DIAGNOSIS — F419 Anxiety disorder, unspecified: Secondary | ICD-10-CM | POA: Diagnosis not present

## 2018-07-16 DIAGNOSIS — F419 Anxiety disorder, unspecified: Secondary | ICD-10-CM | POA: Diagnosis not present

## 2018-07-23 DIAGNOSIS — F419 Anxiety disorder, unspecified: Secondary | ICD-10-CM | POA: Diagnosis not present

## 2018-07-25 DIAGNOSIS — E119 Type 2 diabetes mellitus without complications: Secondary | ICD-10-CM | POA: Diagnosis not present

## 2018-07-25 DIAGNOSIS — M159 Polyosteoarthritis, unspecified: Secondary | ICD-10-CM | POA: Diagnosis not present

## 2018-07-25 DIAGNOSIS — I10 Essential (primary) hypertension: Secondary | ICD-10-CM | POA: Diagnosis not present

## 2018-07-30 DIAGNOSIS — F419 Anxiety disorder, unspecified: Secondary | ICD-10-CM | POA: Diagnosis not present

## 2018-08-06 DIAGNOSIS — F419 Anxiety disorder, unspecified: Secondary | ICD-10-CM | POA: Diagnosis not present

## 2018-08-13 DIAGNOSIS — F419 Anxiety disorder, unspecified: Secondary | ICD-10-CM | POA: Diagnosis not present

## 2018-08-22 DIAGNOSIS — E119 Type 2 diabetes mellitus without complications: Secondary | ICD-10-CM | POA: Diagnosis not present

## 2018-08-22 DIAGNOSIS — I10 Essential (primary) hypertension: Secondary | ICD-10-CM | POA: Diagnosis not present

## 2018-08-22 DIAGNOSIS — M159 Polyosteoarthritis, unspecified: Secondary | ICD-10-CM | POA: Diagnosis not present

## 2018-09-03 DIAGNOSIS — F419 Anxiety disorder, unspecified: Secondary | ICD-10-CM | POA: Diagnosis not present

## 2018-09-10 DIAGNOSIS — F419 Anxiety disorder, unspecified: Secondary | ICD-10-CM | POA: Diagnosis not present

## 2018-09-17 DIAGNOSIS — F419 Anxiety disorder, unspecified: Secondary | ICD-10-CM | POA: Diagnosis not present

## 2018-09-18 DIAGNOSIS — M159 Polyosteoarthritis, unspecified: Secondary | ICD-10-CM | POA: Diagnosis not present

## 2018-09-18 DIAGNOSIS — I10 Essential (primary) hypertension: Secondary | ICD-10-CM | POA: Diagnosis not present

## 2018-09-18 DIAGNOSIS — E119 Type 2 diabetes mellitus without complications: Secondary | ICD-10-CM | POA: Diagnosis not present

## 2018-09-24 DIAGNOSIS — F419 Anxiety disorder, unspecified: Secondary | ICD-10-CM | POA: Diagnosis not present

## 2018-09-29 DIAGNOSIS — Z0000 Encounter for general adult medical examination without abnormal findings: Secondary | ICD-10-CM | POA: Diagnosis not present

## 2018-09-29 DIAGNOSIS — Z79899 Other long term (current) drug therapy: Secondary | ICD-10-CM | POA: Diagnosis not present

## 2018-09-29 DIAGNOSIS — R5383 Other fatigue: Secondary | ICD-10-CM | POA: Diagnosis not present

## 2018-09-29 DIAGNOSIS — E7800 Pure hypercholesterolemia, unspecified: Secondary | ICD-10-CM | POA: Diagnosis not present

## 2018-09-29 DIAGNOSIS — E1165 Type 2 diabetes mellitus with hyperglycemia: Secondary | ICD-10-CM | POA: Diagnosis not present

## 2018-09-29 DIAGNOSIS — I10 Essential (primary) hypertension: Secondary | ICD-10-CM | POA: Diagnosis not present

## 2018-09-29 DIAGNOSIS — Z1331 Encounter for screening for depression: Secondary | ICD-10-CM | POA: Diagnosis not present

## 2018-09-29 DIAGNOSIS — Z299 Encounter for prophylactic measures, unspecified: Secondary | ICD-10-CM | POA: Diagnosis not present

## 2018-09-29 DIAGNOSIS — Z1211 Encounter for screening for malignant neoplasm of colon: Secondary | ICD-10-CM | POA: Diagnosis not present

## 2018-09-29 DIAGNOSIS — Z1339 Encounter for screening examination for other mental health and behavioral disorders: Secondary | ICD-10-CM | POA: Diagnosis not present

## 2018-09-29 DIAGNOSIS — Z7189 Other specified counseling: Secondary | ICD-10-CM | POA: Diagnosis not present

## 2018-09-29 DIAGNOSIS — Z6826 Body mass index (BMI) 26.0-26.9, adult: Secondary | ICD-10-CM | POA: Diagnosis not present

## 2018-10-01 DIAGNOSIS — F419 Anxiety disorder, unspecified: Secondary | ICD-10-CM | POA: Diagnosis not present

## 2018-10-08 DIAGNOSIS — F419 Anxiety disorder, unspecified: Secondary | ICD-10-CM | POA: Diagnosis not present

## 2018-10-09 ENCOUNTER — Ambulatory Visit (INDEPENDENT_AMBULATORY_CARE_PROVIDER_SITE_OTHER): Payer: Medicare Other | Admitting: Otolaryngology

## 2018-10-09 DIAGNOSIS — D440 Neoplasm of uncertain behavior of thyroid gland: Secondary | ICD-10-CM | POA: Diagnosis not present

## 2018-10-15 DIAGNOSIS — F419 Anxiety disorder, unspecified: Secondary | ICD-10-CM | POA: Diagnosis not present

## 2018-10-16 DIAGNOSIS — M159 Polyosteoarthritis, unspecified: Secondary | ICD-10-CM | POA: Diagnosis not present

## 2018-10-16 DIAGNOSIS — E119 Type 2 diabetes mellitus without complications: Secondary | ICD-10-CM | POA: Diagnosis not present

## 2018-10-16 DIAGNOSIS — I10 Essential (primary) hypertension: Secondary | ICD-10-CM | POA: Diagnosis not present

## 2018-10-22 DIAGNOSIS — F419 Anxiety disorder, unspecified: Secondary | ICD-10-CM | POA: Diagnosis not present

## 2018-10-29 DIAGNOSIS — F419 Anxiety disorder, unspecified: Secondary | ICD-10-CM | POA: Diagnosis not present

## 2018-11-05 DIAGNOSIS — F419 Anxiety disorder, unspecified: Secondary | ICD-10-CM | POA: Diagnosis not present

## 2018-11-12 DIAGNOSIS — F419 Anxiety disorder, unspecified: Secondary | ICD-10-CM | POA: Diagnosis not present

## 2018-11-18 DIAGNOSIS — M159 Polyosteoarthritis, unspecified: Secondary | ICD-10-CM | POA: Diagnosis not present

## 2018-11-18 DIAGNOSIS — E119 Type 2 diabetes mellitus without complications: Secondary | ICD-10-CM | POA: Diagnosis not present

## 2018-11-18 DIAGNOSIS — I10 Essential (primary) hypertension: Secondary | ICD-10-CM | POA: Diagnosis not present

## 2018-11-19 DIAGNOSIS — F419 Anxiety disorder, unspecified: Secondary | ICD-10-CM | POA: Diagnosis not present

## 2018-11-19 IMAGING — US US THYROID
1 series · 13 of 25 positions shown · non-contrast
Comparison: Prior thyroid ultrasound 09/17/2016

CLINICAL DATA: Other. 67-year-old female with a known left-sided
thyroid cyst.

EXAM:
THYROID ULTRASOUND
TECHNIQUE: Ultrasound examination of the thyroid gland and adjacent soft
tissues was performed.

[Series 1: us thyroid · 0.06mm/px · 13 of 29 slices shown]
[im 1/29]
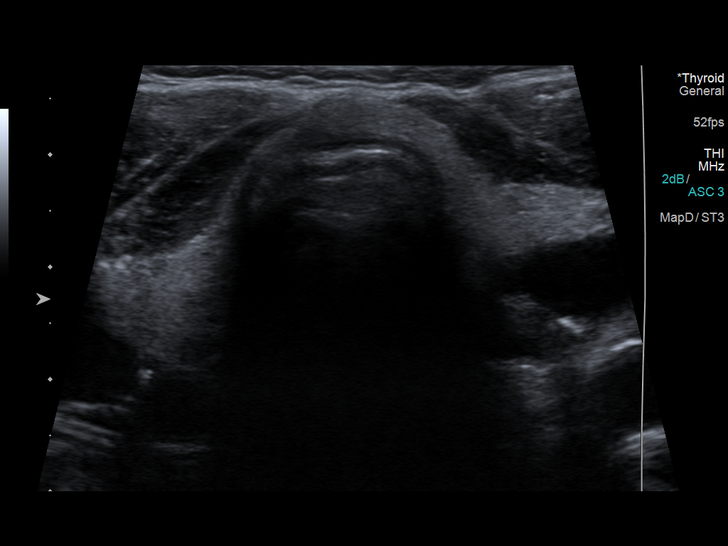
[im 3/29]
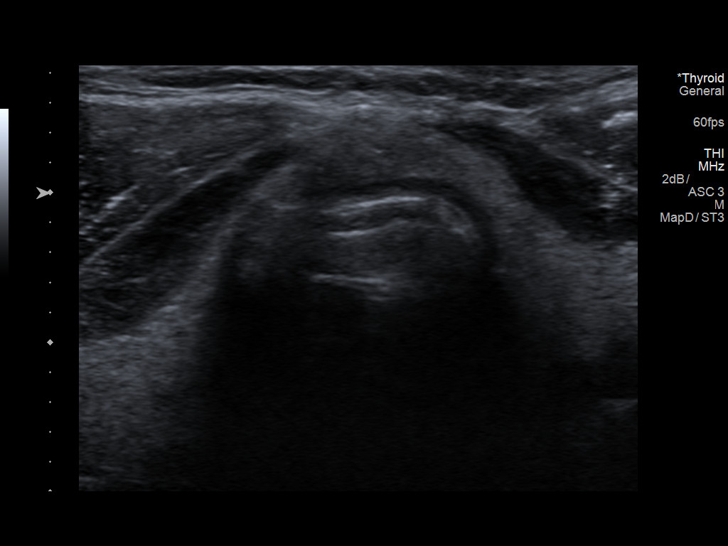
[im 5/29]
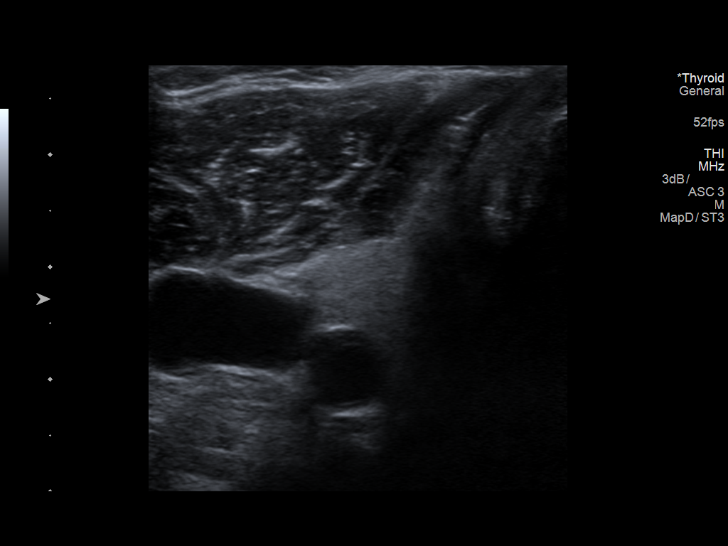
[im 8/29]
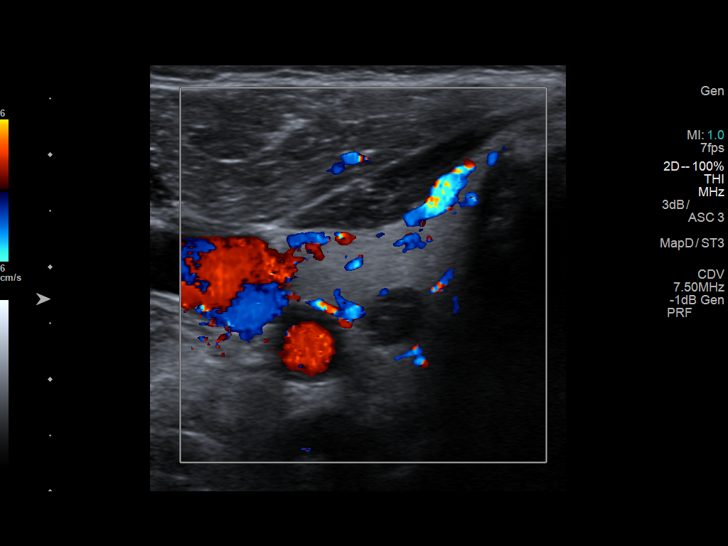
[im 10/29]
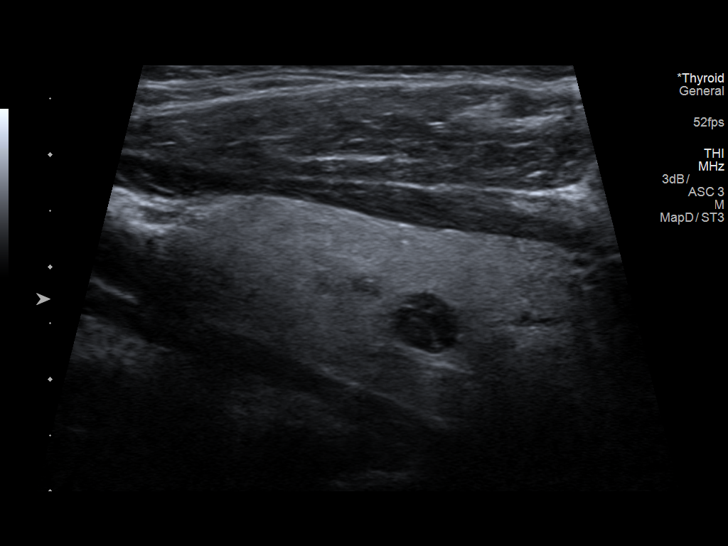
[im 12/29]
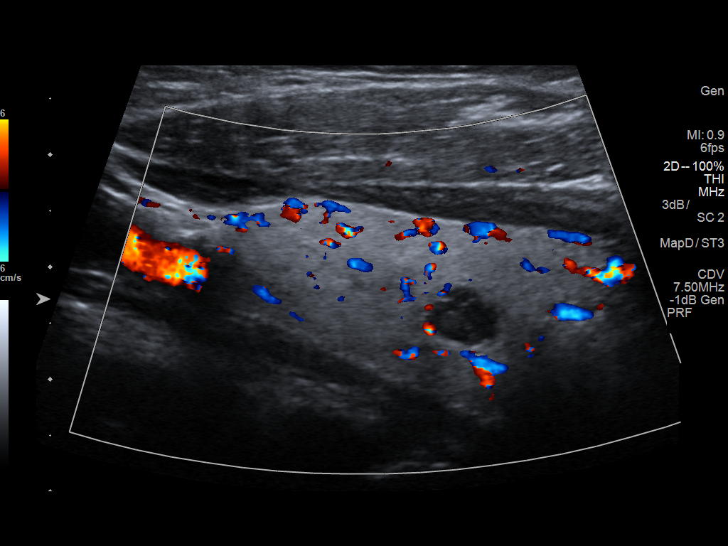
[im 15/29]
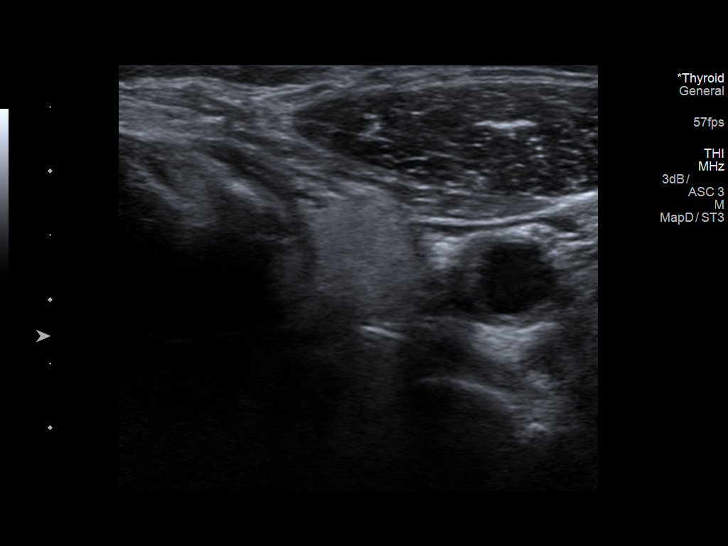
[im 17/29]
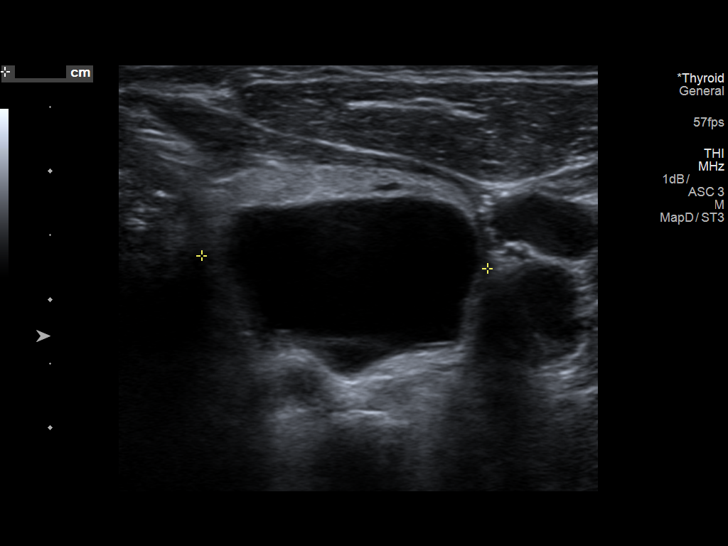
[im 19/29]
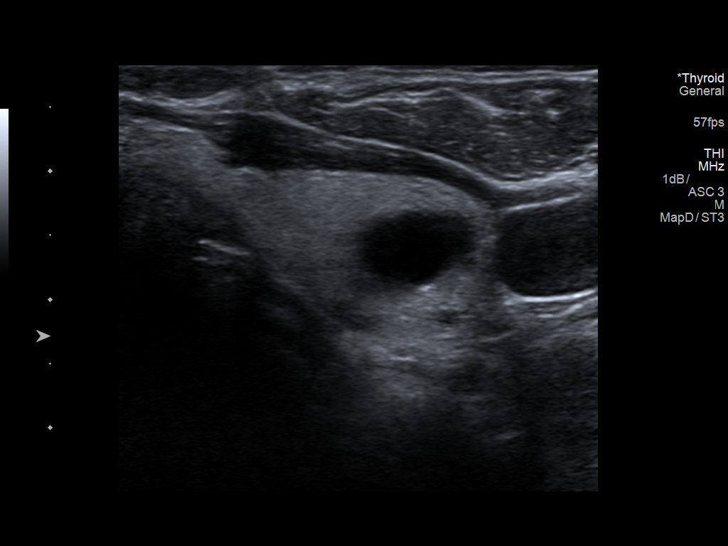
[im 22/29]
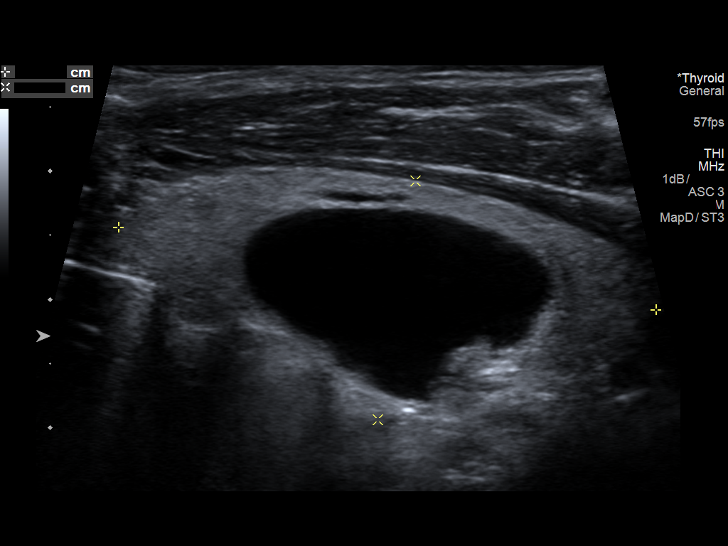
[im 24/29]
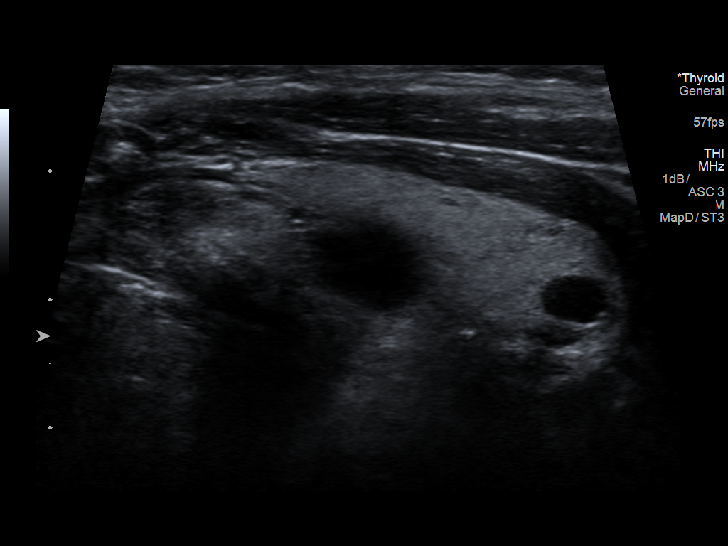
[im 26/29]
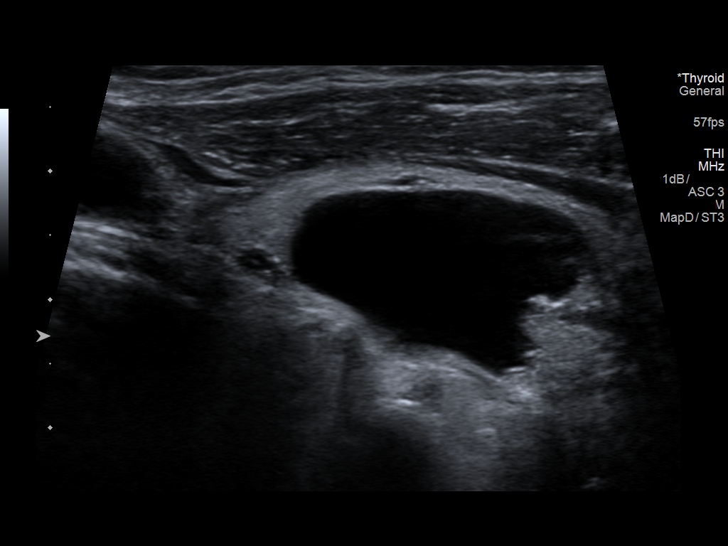
[im 29/29]
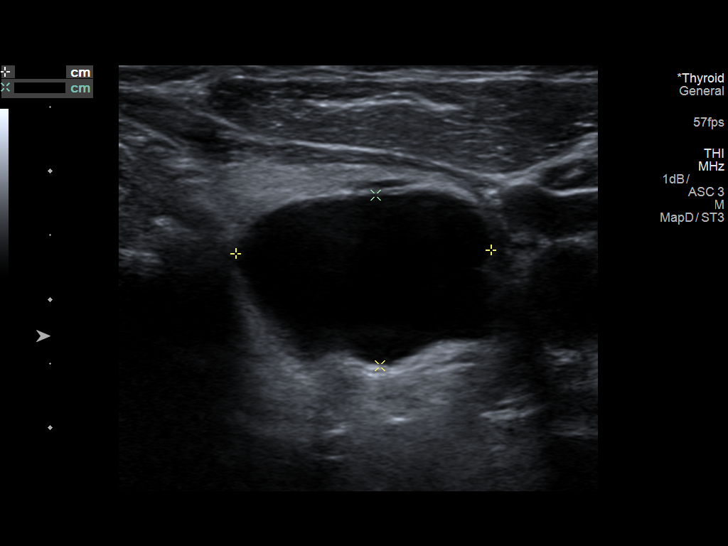

[13 of 25 positions shown; findings below may reference images not displayed]

FINDINGS: Parenchymal Echotexture: Mildly heterogenous

Isthmus: 0.4 cm

Right lobe: 3.8 x 1.6 x 1.6 cm

Left lobe: 4.2 x 1.9 x 2.2 cm

_________________________________________________________

Estimated total number of nodules >/= 1 cm: 1

Number of spongiform nodules >/=  2 cm not described below (TR1): 0

Number of mixed cystic and solid nodules >/= 1.5 cm not described
below (TR2): 0

_________________________________________________________

The anechoic simple cyst with minimal internal colloid debris in the
left mid gland is very similar compared to prior at 2.4 x 2.0 x
cm. This remains consistent with a benign cyst. Tiny subcentimeter
hypoechoic nodule on the right also remains unchanged. These lesions
do not meet criteria for biopsy or dedicated imaging surveillance.
IMPRESSION: Stable appearance of minimally complex left thyroid cyst. This is
considered a sonographically benign lesion and does not require
further evaluation.

The above is in keeping with the ACR TI-RADS recommendations - [HOSPITAL] 9191;[DATE].

## 2018-11-26 DIAGNOSIS — F419 Anxiety disorder, unspecified: Secondary | ICD-10-CM | POA: Diagnosis not present

## 2018-12-10 DIAGNOSIS — F419 Anxiety disorder, unspecified: Secondary | ICD-10-CM | POA: Diagnosis not present

## 2018-12-15 DIAGNOSIS — I10 Essential (primary) hypertension: Secondary | ICD-10-CM | POA: Diagnosis not present

## 2018-12-15 DIAGNOSIS — M159 Polyosteoarthritis, unspecified: Secondary | ICD-10-CM | POA: Diagnosis not present

## 2018-12-15 DIAGNOSIS — E119 Type 2 diabetes mellitus without complications: Secondary | ICD-10-CM | POA: Diagnosis not present

## 2018-12-17 DIAGNOSIS — F419 Anxiety disorder, unspecified: Secondary | ICD-10-CM | POA: Diagnosis not present

## 2018-12-24 DIAGNOSIS — F419 Anxiety disorder, unspecified: Secondary | ICD-10-CM | POA: Diagnosis not present

## 2018-12-31 DIAGNOSIS — F419 Anxiety disorder, unspecified: Secondary | ICD-10-CM | POA: Diagnosis not present

## 2019-01-06 DIAGNOSIS — Z299 Encounter for prophylactic measures, unspecified: Secondary | ICD-10-CM | POA: Diagnosis not present

## 2019-01-06 DIAGNOSIS — E7800 Pure hypercholesterolemia, unspecified: Secondary | ICD-10-CM | POA: Diagnosis not present

## 2019-01-06 DIAGNOSIS — E1165 Type 2 diabetes mellitus with hyperglycemia: Secondary | ICD-10-CM | POA: Diagnosis not present

## 2019-01-06 DIAGNOSIS — I10 Essential (primary) hypertension: Secondary | ICD-10-CM | POA: Diagnosis not present

## 2019-01-06 DIAGNOSIS — E1142 Type 2 diabetes mellitus with diabetic polyneuropathy: Secondary | ICD-10-CM | POA: Diagnosis not present

## 2019-01-06 DIAGNOSIS — Z6826 Body mass index (BMI) 26.0-26.9, adult: Secondary | ICD-10-CM | POA: Diagnosis not present

## 2019-01-07 DIAGNOSIS — F419 Anxiety disorder, unspecified: Secondary | ICD-10-CM | POA: Diagnosis not present

## 2019-01-14 DIAGNOSIS — F419 Anxiety disorder, unspecified: Secondary | ICD-10-CM | POA: Diagnosis not present

## 2019-01-21 DIAGNOSIS — F419 Anxiety disorder, unspecified: Secondary | ICD-10-CM | POA: Diagnosis not present

## 2019-01-28 DIAGNOSIS — F419 Anxiety disorder, unspecified: Secondary | ICD-10-CM | POA: Diagnosis not present

## 2019-02-04 DIAGNOSIS — F419 Anxiety disorder, unspecified: Secondary | ICD-10-CM | POA: Diagnosis not present

## 2019-03-04 DIAGNOSIS — E119 Type 2 diabetes mellitus without complications: Secondary | ICD-10-CM | POA: Diagnosis not present

## 2019-03-04 DIAGNOSIS — M159 Polyosteoarthritis, unspecified: Secondary | ICD-10-CM | POA: Diagnosis not present

## 2019-03-04 DIAGNOSIS — I10 Essential (primary) hypertension: Secondary | ICD-10-CM | POA: Diagnosis not present

## 2019-03-09 DIAGNOSIS — Z79899 Other long term (current) drug therapy: Secondary | ICD-10-CM | POA: Diagnosis not present

## 2019-03-09 DIAGNOSIS — M19041 Primary osteoarthritis, right hand: Secondary | ICD-10-CM | POA: Diagnosis not present

## 2019-03-09 DIAGNOSIS — E119 Type 2 diabetes mellitus without complications: Secondary | ICD-10-CM | POA: Diagnosis not present

## 2019-03-09 DIAGNOSIS — I10 Essential (primary) hypertension: Secondary | ICD-10-CM | POA: Diagnosis not present

## 2019-03-09 DIAGNOSIS — E7800 Pure hypercholesterolemia, unspecified: Secondary | ICD-10-CM | POA: Diagnosis not present

## 2019-03-09 DIAGNOSIS — N390 Urinary tract infection, site not specified: Secondary | ICD-10-CM | POA: Diagnosis not present

## 2019-03-09 DIAGNOSIS — M19042 Primary osteoarthritis, left hand: Secondary | ICD-10-CM | POA: Diagnosis not present

## 2019-03-11 DIAGNOSIS — F419 Anxiety disorder, unspecified: Secondary | ICD-10-CM | POA: Diagnosis not present

## 2019-03-18 DIAGNOSIS — M419 Scoliosis, unspecified: Secondary | ICD-10-CM | POA: Diagnosis not present

## 2019-03-25 DIAGNOSIS — F419 Anxiety disorder, unspecified: Secondary | ICD-10-CM | POA: Diagnosis not present

## 2019-04-01 DIAGNOSIS — I10 Essential (primary) hypertension: Secondary | ICD-10-CM | POA: Diagnosis not present

## 2019-04-01 DIAGNOSIS — M159 Polyosteoarthritis, unspecified: Secondary | ICD-10-CM | POA: Diagnosis not present

## 2019-04-01 DIAGNOSIS — F419 Anxiety disorder, unspecified: Secondary | ICD-10-CM | POA: Diagnosis not present

## 2019-04-01 DIAGNOSIS — E119 Type 2 diabetes mellitus without complications: Secondary | ICD-10-CM | POA: Diagnosis not present

## 2019-04-08 DIAGNOSIS — F419 Anxiety disorder, unspecified: Secondary | ICD-10-CM | POA: Diagnosis not present

## 2019-04-14 DIAGNOSIS — M47817 Spondylosis without myelopathy or radiculopathy, lumbosacral region: Secondary | ICD-10-CM | POA: Diagnosis not present

## 2019-04-14 DIAGNOSIS — I10 Essential (primary) hypertension: Secondary | ICD-10-CM | POA: Diagnosis not present

## 2019-04-14 DIAGNOSIS — Z6828 Body mass index (BMI) 28.0-28.9, adult: Secondary | ICD-10-CM | POA: Diagnosis not present

## 2019-04-14 DIAGNOSIS — R300 Dysuria: Secondary | ICD-10-CM | POA: Diagnosis not present

## 2019-04-14 DIAGNOSIS — Z299 Encounter for prophylactic measures, unspecified: Secondary | ICD-10-CM | POA: Diagnosis not present

## 2019-04-14 DIAGNOSIS — E1165 Type 2 diabetes mellitus with hyperglycemia: Secondary | ICD-10-CM | POA: Diagnosis not present

## 2019-04-15 DIAGNOSIS — F419 Anxiety disorder, unspecified: Secondary | ICD-10-CM | POA: Diagnosis not present

## 2019-04-22 DIAGNOSIS — F419 Anxiety disorder, unspecified: Secondary | ICD-10-CM | POA: Diagnosis not present

## 2019-05-04 DIAGNOSIS — M5416 Radiculopathy, lumbar region: Secondary | ICD-10-CM | POA: Diagnosis not present

## 2019-05-04 DIAGNOSIS — M5116 Intervertebral disc disorders with radiculopathy, lumbar region: Secondary | ICD-10-CM | POA: Diagnosis not present

## 2019-05-05 DIAGNOSIS — M159 Polyosteoarthritis, unspecified: Secondary | ICD-10-CM | POA: Diagnosis not present

## 2019-05-05 DIAGNOSIS — E119 Type 2 diabetes mellitus without complications: Secondary | ICD-10-CM | POA: Diagnosis not present

## 2019-05-05 DIAGNOSIS — I10 Essential (primary) hypertension: Secondary | ICD-10-CM | POA: Diagnosis not present

## 2019-05-06 DIAGNOSIS — F419 Anxiety disorder, unspecified: Secondary | ICD-10-CM | POA: Diagnosis not present

## 2019-05-13 DIAGNOSIS — F419 Anxiety disorder, unspecified: Secondary | ICD-10-CM | POA: Diagnosis not present

## 2019-05-20 DIAGNOSIS — F419 Anxiety disorder, unspecified: Secondary | ICD-10-CM | POA: Diagnosis not present

## 2019-05-27 DIAGNOSIS — F419 Anxiety disorder, unspecified: Secondary | ICD-10-CM | POA: Diagnosis not present

## 2019-06-03 DIAGNOSIS — F419 Anxiety disorder, unspecified: Secondary | ICD-10-CM | POA: Diagnosis not present

## 2019-06-10 DIAGNOSIS — F419 Anxiety disorder, unspecified: Secondary | ICD-10-CM | POA: Diagnosis not present

## 2019-06-17 DIAGNOSIS — E119 Type 2 diabetes mellitus without complications: Secondary | ICD-10-CM | POA: Diagnosis not present

## 2019-06-17 DIAGNOSIS — I10 Essential (primary) hypertension: Secondary | ICD-10-CM | POA: Diagnosis not present

## 2019-06-17 DIAGNOSIS — M159 Polyosteoarthritis, unspecified: Secondary | ICD-10-CM | POA: Diagnosis not present

## 2019-06-17 DIAGNOSIS — F419 Anxiety disorder, unspecified: Secondary | ICD-10-CM | POA: Diagnosis not present

## 2019-06-24 DIAGNOSIS — F419 Anxiety disorder, unspecified: Secondary | ICD-10-CM | POA: Diagnosis not present

## 2019-07-01 DIAGNOSIS — F419 Anxiety disorder, unspecified: Secondary | ICD-10-CM | POA: Diagnosis not present

## 2019-07-08 DIAGNOSIS — F419 Anxiety disorder, unspecified: Secondary | ICD-10-CM | POA: Diagnosis not present

## 2019-07-15 DIAGNOSIS — F419 Anxiety disorder, unspecified: Secondary | ICD-10-CM | POA: Diagnosis not present

## 2019-07-22 DIAGNOSIS — F419 Anxiety disorder, unspecified: Secondary | ICD-10-CM | POA: Diagnosis not present

## 2019-07-23 DIAGNOSIS — Z299 Encounter for prophylactic measures, unspecified: Secondary | ICD-10-CM | POA: Diagnosis not present

## 2019-07-23 DIAGNOSIS — E1142 Type 2 diabetes mellitus with diabetic polyneuropathy: Secondary | ICD-10-CM | POA: Diagnosis not present

## 2019-07-23 DIAGNOSIS — I10 Essential (primary) hypertension: Secondary | ICD-10-CM | POA: Diagnosis not present

## 2019-07-23 DIAGNOSIS — E1165 Type 2 diabetes mellitus with hyperglycemia: Secondary | ICD-10-CM | POA: Diagnosis not present

## 2019-07-23 DIAGNOSIS — E7800 Pure hypercholesterolemia, unspecified: Secondary | ICD-10-CM | POA: Diagnosis not present

## 2019-07-29 DIAGNOSIS — F419 Anxiety disorder, unspecified: Secondary | ICD-10-CM | POA: Diagnosis not present

## 2019-08-02 DIAGNOSIS — E119 Type 2 diabetes mellitus without complications: Secondary | ICD-10-CM | POA: Diagnosis not present

## 2019-08-02 DIAGNOSIS — M159 Polyosteoarthritis, unspecified: Secondary | ICD-10-CM | POA: Diagnosis not present

## 2019-08-02 DIAGNOSIS — I10 Essential (primary) hypertension: Secondary | ICD-10-CM | POA: Diagnosis not present

## 2019-09-02 DIAGNOSIS — I10 Essential (primary) hypertension: Secondary | ICD-10-CM | POA: Diagnosis not present

## 2019-09-02 DIAGNOSIS — M159 Polyosteoarthritis, unspecified: Secondary | ICD-10-CM | POA: Diagnosis not present

## 2019-09-02 DIAGNOSIS — E119 Type 2 diabetes mellitus without complications: Secondary | ICD-10-CM | POA: Diagnosis not present

## 2019-09-03 DIAGNOSIS — F419 Anxiety disorder, unspecified: Secondary | ICD-10-CM | POA: Diagnosis not present

## 2019-09-09 DIAGNOSIS — F419 Anxiety disorder, unspecified: Secondary | ICD-10-CM | POA: Diagnosis not present

## 2019-09-17 ENCOUNTER — Other Ambulatory Visit (HOSPITAL_COMMUNITY): Payer: Self-pay | Admitting: Otolaryngology

## 2019-09-17 ENCOUNTER — Other Ambulatory Visit: Payer: Self-pay | Admitting: Otolaryngology

## 2019-09-17 DIAGNOSIS — F419 Anxiety disorder, unspecified: Secondary | ICD-10-CM | POA: Diagnosis not present

## 2019-09-17 DIAGNOSIS — E041 Nontoxic single thyroid nodule: Secondary | ICD-10-CM

## 2019-09-23 DIAGNOSIS — F419 Anxiety disorder, unspecified: Secondary | ICD-10-CM | POA: Diagnosis not present

## 2019-09-30 DIAGNOSIS — F419 Anxiety disorder, unspecified: Secondary | ICD-10-CM | POA: Diagnosis not present

## 2019-10-02 DIAGNOSIS — E119 Type 2 diabetes mellitus without complications: Secondary | ICD-10-CM | POA: Diagnosis not present

## 2019-10-02 DIAGNOSIS — I10 Essential (primary) hypertension: Secondary | ICD-10-CM | POA: Diagnosis not present

## 2019-10-02 DIAGNOSIS — M159 Polyosteoarthritis, unspecified: Secondary | ICD-10-CM | POA: Diagnosis not present

## 2019-10-07 DIAGNOSIS — F419 Anxiety disorder, unspecified: Secondary | ICD-10-CM | POA: Diagnosis not present

## 2019-10-09 DIAGNOSIS — Z79899 Other long term (current) drug therapy: Secondary | ICD-10-CM | POA: Diagnosis not present

## 2019-10-09 DIAGNOSIS — Z0000 Encounter for general adult medical examination without abnormal findings: Secondary | ICD-10-CM | POA: Diagnosis not present

## 2019-10-09 DIAGNOSIS — Z6827 Body mass index (BMI) 27.0-27.9, adult: Secondary | ICD-10-CM | POA: Diagnosis not present

## 2019-10-09 DIAGNOSIS — I10 Essential (primary) hypertension: Secondary | ICD-10-CM | POA: Diagnosis not present

## 2019-10-09 DIAGNOSIS — R5383 Other fatigue: Secondary | ICD-10-CM | POA: Diagnosis not present

## 2019-10-09 DIAGNOSIS — Z7189 Other specified counseling: Secondary | ICD-10-CM | POA: Diagnosis not present

## 2019-10-09 DIAGNOSIS — Z1331 Encounter for screening for depression: Secondary | ICD-10-CM | POA: Diagnosis not present

## 2019-10-09 DIAGNOSIS — E7800 Pure hypercholesterolemia, unspecified: Secondary | ICD-10-CM | POA: Diagnosis not present

## 2019-10-09 DIAGNOSIS — Z299 Encounter for prophylactic measures, unspecified: Secondary | ICD-10-CM | POA: Diagnosis not present

## 2019-10-09 DIAGNOSIS — E1165 Type 2 diabetes mellitus with hyperglycemia: Secondary | ICD-10-CM | POA: Diagnosis not present

## 2019-10-09 DIAGNOSIS — Z1339 Encounter for screening examination for other mental health and behavioral disorders: Secondary | ICD-10-CM | POA: Diagnosis not present

## 2019-10-14 DIAGNOSIS — F419 Anxiety disorder, unspecified: Secondary | ICD-10-CM | POA: Diagnosis not present

## 2019-10-28 DIAGNOSIS — F419 Anxiety disorder, unspecified: Secondary | ICD-10-CM | POA: Diagnosis not present

## 2019-11-04 DIAGNOSIS — F419 Anxiety disorder, unspecified: Secondary | ICD-10-CM | POA: Diagnosis not present

## 2019-11-11 DIAGNOSIS — F419 Anxiety disorder, unspecified: Secondary | ICD-10-CM | POA: Diagnosis not present

## 2019-11-18 DIAGNOSIS — F419 Anxiety disorder, unspecified: Secondary | ICD-10-CM | POA: Diagnosis not present

## 2019-11-20 ENCOUNTER — Other Ambulatory Visit: Payer: Self-pay

## 2019-11-20 ENCOUNTER — Ambulatory Visit (HOSPITAL_COMMUNITY)
Admission: RE | Admit: 2019-11-20 | Discharge: 2019-11-20 | Disposition: A | Discharge status: Home / Self Care | Payer: Medicare Other | Source: Ambulatory Visit | Attending: Otolaryngology | Admitting: Otolaryngology

## 2019-11-20 DIAGNOSIS — E041 Nontoxic single thyroid nodule: Secondary | ICD-10-CM | POA: Diagnosis not present

## 2019-11-23 DIAGNOSIS — Z299 Encounter for prophylactic measures, unspecified: Secondary | ICD-10-CM | POA: Diagnosis not present

## 2019-11-23 DIAGNOSIS — I10 Essential (primary) hypertension: Secondary | ICD-10-CM | POA: Diagnosis not present

## 2019-11-23 DIAGNOSIS — E1142 Type 2 diabetes mellitus with diabetic polyneuropathy: Secondary | ICD-10-CM | POA: Diagnosis not present

## 2019-11-23 DIAGNOSIS — E1165 Type 2 diabetes mellitus with hyperglycemia: Secondary | ICD-10-CM | POA: Diagnosis not present

## 2019-12-02 DIAGNOSIS — F419 Anxiety disorder, unspecified: Secondary | ICD-10-CM | POA: Diagnosis not present

## 2019-12-03 DIAGNOSIS — I10 Essential (primary) hypertension: Secondary | ICD-10-CM | POA: Diagnosis not present

## 2019-12-03 DIAGNOSIS — M159 Polyosteoarthritis, unspecified: Secondary | ICD-10-CM | POA: Diagnosis not present

## 2019-12-03 DIAGNOSIS — E119 Type 2 diabetes mellitus without complications: Secondary | ICD-10-CM | POA: Diagnosis not present

## 2019-12-09 DIAGNOSIS — F419 Anxiety disorder, unspecified: Secondary | ICD-10-CM | POA: Diagnosis not present

## 2019-12-16 DIAGNOSIS — F419 Anxiety disorder, unspecified: Secondary | ICD-10-CM | POA: Diagnosis not present

## 2019-12-23 DIAGNOSIS — F419 Anxiety disorder, unspecified: Secondary | ICD-10-CM | POA: Diagnosis not present

## 2019-12-25 DIAGNOSIS — H6123 Impacted cerumen, bilateral: Secondary | ICD-10-CM | POA: Diagnosis not present

## 2019-12-25 DIAGNOSIS — D440 Neoplasm of uncertain behavior of thyroid gland: Secondary | ICD-10-CM | POA: Diagnosis not present

## 2020-01-01 DIAGNOSIS — M159 Polyosteoarthritis, unspecified: Secondary | ICD-10-CM | POA: Diagnosis not present

## 2020-01-01 DIAGNOSIS — I10 Essential (primary) hypertension: Secondary | ICD-10-CM | POA: Diagnosis not present

## 2020-01-01 DIAGNOSIS — E119 Type 2 diabetes mellitus without complications: Secondary | ICD-10-CM | POA: Diagnosis not present

## 2020-01-06 DIAGNOSIS — F419 Anxiety disorder, unspecified: Secondary | ICD-10-CM | POA: Diagnosis not present

## 2020-01-13 DIAGNOSIS — F419 Anxiety disorder, unspecified: Secondary | ICD-10-CM | POA: Diagnosis not present

## 2020-01-20 DIAGNOSIS — F419 Anxiety disorder, unspecified: Secondary | ICD-10-CM | POA: Diagnosis not present

## 2020-01-27 DIAGNOSIS — F419 Anxiety disorder, unspecified: Secondary | ICD-10-CM | POA: Diagnosis not present

## 2020-02-01 DIAGNOSIS — Z23 Encounter for immunization: Secondary | ICD-10-CM | POA: Diagnosis not present

## 2020-02-02 DIAGNOSIS — I10 Essential (primary) hypertension: Secondary | ICD-10-CM | POA: Diagnosis not present

## 2020-02-02 DIAGNOSIS — E119 Type 2 diabetes mellitus without complications: Secondary | ICD-10-CM | POA: Diagnosis not present

## 2020-02-02 DIAGNOSIS — M159 Polyosteoarthritis, unspecified: Secondary | ICD-10-CM | POA: Diagnosis not present

## 2020-02-03 DIAGNOSIS — F419 Anxiety disorder, unspecified: Secondary | ICD-10-CM | POA: Diagnosis not present

## 2020-02-10 DIAGNOSIS — F419 Anxiety disorder, unspecified: Secondary | ICD-10-CM | POA: Diagnosis not present

## 2020-04-06 DIAGNOSIS — F419 Anxiety disorder, unspecified: Secondary | ICD-10-CM | POA: Diagnosis not present

## 2020-04-08 DIAGNOSIS — I10 Essential (primary) hypertension: Secondary | ICD-10-CM | POA: Diagnosis not present

## 2020-04-08 DIAGNOSIS — Z299 Encounter for prophylactic measures, unspecified: Secondary | ICD-10-CM | POA: Diagnosis not present

## 2020-04-08 DIAGNOSIS — M1710 Unilateral primary osteoarthritis, unspecified knee: Secondary | ICD-10-CM | POA: Diagnosis not present

## 2020-04-08 DIAGNOSIS — E1142 Type 2 diabetes mellitus with diabetic polyneuropathy: Secondary | ICD-10-CM | POA: Diagnosis not present

## 2020-04-08 DIAGNOSIS — E1165 Type 2 diabetes mellitus with hyperglycemia: Secondary | ICD-10-CM | POA: Diagnosis not present

## 2020-04-13 DIAGNOSIS — F419 Anxiety disorder, unspecified: Secondary | ICD-10-CM | POA: Diagnosis not present

## 2020-04-20 DIAGNOSIS — F419 Anxiety disorder, unspecified: Secondary | ICD-10-CM | POA: Diagnosis not present

## 2020-04-27 DIAGNOSIS — F419 Anxiety disorder, unspecified: Secondary | ICD-10-CM | POA: Diagnosis not present

## 2020-05-04 DIAGNOSIS — F419 Anxiety disorder, unspecified: Secondary | ICD-10-CM | POA: Diagnosis not present

## 2020-05-11 DIAGNOSIS — F419 Anxiety disorder, unspecified: Secondary | ICD-10-CM | POA: Diagnosis not present

## 2020-05-18 DIAGNOSIS — F419 Anxiety disorder, unspecified: Secondary | ICD-10-CM | POA: Diagnosis not present

## 2020-05-25 DIAGNOSIS — F419 Anxiety disorder, unspecified: Secondary | ICD-10-CM | POA: Diagnosis not present

## 2020-06-01 DIAGNOSIS — F419 Anxiety disorder, unspecified: Secondary | ICD-10-CM | POA: Diagnosis not present

## 2020-06-08 DIAGNOSIS — F419 Anxiety disorder, unspecified: Secondary | ICD-10-CM | POA: Diagnosis not present

## 2020-06-15 DIAGNOSIS — F419 Anxiety disorder, unspecified: Secondary | ICD-10-CM | POA: Diagnosis not present

## 2020-06-22 DIAGNOSIS — F419 Anxiety disorder, unspecified: Secondary | ICD-10-CM | POA: Diagnosis not present

## 2020-06-29 DIAGNOSIS — F419 Anxiety disorder, unspecified: Secondary | ICD-10-CM | POA: Diagnosis not present

## 2020-07-06 DIAGNOSIS — F419 Anxiety disorder, unspecified: Secondary | ICD-10-CM | POA: Diagnosis not present

## 2020-07-13 DIAGNOSIS — F419 Anxiety disorder, unspecified: Secondary | ICD-10-CM | POA: Diagnosis not present

## 2020-07-20 DIAGNOSIS — F419 Anxiety disorder, unspecified: Secondary | ICD-10-CM | POA: Diagnosis not present

## 2020-07-27 DIAGNOSIS — F419 Anxiety disorder, unspecified: Secondary | ICD-10-CM | POA: Diagnosis not present

## 2020-07-29 DIAGNOSIS — Z299 Encounter for prophylactic measures, unspecified: Secondary | ICD-10-CM | POA: Diagnosis not present

## 2020-07-29 DIAGNOSIS — R413 Other amnesia: Secondary | ICD-10-CM | POA: Diagnosis not present

## 2020-07-29 DIAGNOSIS — I10 Essential (primary) hypertension: Secondary | ICD-10-CM | POA: Diagnosis not present

## 2020-07-29 DIAGNOSIS — E1165 Type 2 diabetes mellitus with hyperglycemia: Secondary | ICD-10-CM | POA: Diagnosis not present

## 2020-07-29 DIAGNOSIS — E1129 Type 2 diabetes mellitus with other diabetic kidney complication: Secondary | ICD-10-CM | POA: Diagnosis not present

## 2020-07-29 DIAGNOSIS — R350 Frequency of micturition: Secondary | ICD-10-CM | POA: Diagnosis not present

## 2020-08-03 DIAGNOSIS — F419 Anxiety disorder, unspecified: Secondary | ICD-10-CM | POA: Diagnosis not present

## 2020-08-10 DIAGNOSIS — F419 Anxiety disorder, unspecified: Secondary | ICD-10-CM | POA: Diagnosis not present

## 2020-08-17 DIAGNOSIS — F419 Anxiety disorder, unspecified: Secondary | ICD-10-CM | POA: Diagnosis not present

## 2020-08-31 DIAGNOSIS — F419 Anxiety disorder, unspecified: Secondary | ICD-10-CM | POA: Diagnosis not present

## 2020-09-07 DIAGNOSIS — F419 Anxiety disorder, unspecified: Secondary | ICD-10-CM | POA: Diagnosis not present

## 2020-09-14 DIAGNOSIS — F419 Anxiety disorder, unspecified: Secondary | ICD-10-CM | POA: Diagnosis not present

## 2020-09-21 DIAGNOSIS — F419 Anxiety disorder, unspecified: Secondary | ICD-10-CM | POA: Diagnosis not present

## 2020-09-28 DIAGNOSIS — F419 Anxiety disorder, unspecified: Secondary | ICD-10-CM | POA: Diagnosis not present

## 2020-10-05 DIAGNOSIS — F419 Anxiety disorder, unspecified: Secondary | ICD-10-CM | POA: Diagnosis not present

## 2020-10-12 DIAGNOSIS — F419 Anxiety disorder, unspecified: Secondary | ICD-10-CM | POA: Diagnosis not present

## 2020-10-12 DIAGNOSIS — Z0000 Encounter for general adult medical examination without abnormal findings: Secondary | ICD-10-CM | POA: Diagnosis not present

## 2020-10-12 DIAGNOSIS — Z1339 Encounter for screening examination for other mental health and behavioral disorders: Secondary | ICD-10-CM | POA: Diagnosis not present

## 2020-10-12 DIAGNOSIS — E7800 Pure hypercholesterolemia, unspecified: Secondary | ICD-10-CM | POA: Diagnosis not present

## 2020-10-12 DIAGNOSIS — Z6825 Body mass index (BMI) 25.0-25.9, adult: Secondary | ICD-10-CM | POA: Diagnosis not present

## 2020-10-12 DIAGNOSIS — I10 Essential (primary) hypertension: Secondary | ICD-10-CM | POA: Diagnosis not present

## 2020-10-12 DIAGNOSIS — Z7189 Other specified counseling: Secondary | ICD-10-CM | POA: Diagnosis not present

## 2020-10-12 DIAGNOSIS — Z299 Encounter for prophylactic measures, unspecified: Secondary | ICD-10-CM | POA: Diagnosis not present

## 2020-10-12 DIAGNOSIS — E538 Deficiency of other specified B group vitamins: Secondary | ICD-10-CM | POA: Diagnosis not present

## 2020-10-12 DIAGNOSIS — R5383 Other fatigue: Secondary | ICD-10-CM | POA: Diagnosis not present

## 2020-10-12 DIAGNOSIS — Z1331 Encounter for screening for depression: Secondary | ICD-10-CM | POA: Diagnosis not present

## 2020-10-12 DIAGNOSIS — Z79899 Other long term (current) drug therapy: Secondary | ICD-10-CM | POA: Diagnosis not present

## 2020-10-12 DIAGNOSIS — E8940 Asymptomatic postprocedural ovarian failure: Secondary | ICD-10-CM | POA: Diagnosis not present

## 2020-10-12 DIAGNOSIS — E1165 Type 2 diabetes mellitus with hyperglycemia: Secondary | ICD-10-CM | POA: Diagnosis not present

## 2020-10-12 DIAGNOSIS — E559 Vitamin D deficiency, unspecified: Secondary | ICD-10-CM | POA: Diagnosis not present

## 2020-10-19 DIAGNOSIS — F419 Anxiety disorder, unspecified: Secondary | ICD-10-CM | POA: Diagnosis not present

## 2020-10-26 DIAGNOSIS — F419 Anxiety disorder, unspecified: Secondary | ICD-10-CM | POA: Diagnosis not present

## 2020-11-02 DIAGNOSIS — F419 Anxiety disorder, unspecified: Secondary | ICD-10-CM | POA: Diagnosis not present

## 2020-11-09 DIAGNOSIS — F419 Anxiety disorder, unspecified: Secondary | ICD-10-CM | POA: Diagnosis not present

## 2020-11-11 DIAGNOSIS — I10 Essential (primary) hypertension: Secondary | ICD-10-CM | POA: Diagnosis not present

## 2020-11-11 DIAGNOSIS — Z299 Encounter for prophylactic measures, unspecified: Secondary | ICD-10-CM | POA: Diagnosis not present

## 2020-11-11 DIAGNOSIS — E1165 Type 2 diabetes mellitus with hyperglycemia: Secondary | ICD-10-CM | POA: Diagnosis not present

## 2020-11-16 DIAGNOSIS — F419 Anxiety disorder, unspecified: Secondary | ICD-10-CM | POA: Diagnosis not present

## 2020-11-23 DIAGNOSIS — F419 Anxiety disorder, unspecified: Secondary | ICD-10-CM | POA: Diagnosis not present

## 2020-12-07 DIAGNOSIS — F419 Anxiety disorder, unspecified: Secondary | ICD-10-CM | POA: Diagnosis not present

## 2020-12-21 DIAGNOSIS — F419 Anxiety disorder, unspecified: Secondary | ICD-10-CM | POA: Diagnosis not present

## 2020-12-28 DIAGNOSIS — F419 Anxiety disorder, unspecified: Secondary | ICD-10-CM | POA: Diagnosis not present

## 2021-01-04 DIAGNOSIS — F419 Anxiety disorder, unspecified: Secondary | ICD-10-CM | POA: Diagnosis not present

## 2021-01-11 DIAGNOSIS — F419 Anxiety disorder, unspecified: Secondary | ICD-10-CM | POA: Diagnosis not present

## 2021-01-18 DIAGNOSIS — F419 Anxiety disorder, unspecified: Secondary | ICD-10-CM | POA: Diagnosis not present

## 2021-02-01 DIAGNOSIS — F419 Anxiety disorder, unspecified: Secondary | ICD-10-CM | POA: Diagnosis not present

## 2021-02-08 DIAGNOSIS — F419 Anxiety disorder, unspecified: Secondary | ICD-10-CM | POA: Diagnosis not present

## 2021-02-15 DIAGNOSIS — F419 Anxiety disorder, unspecified: Secondary | ICD-10-CM | POA: Diagnosis not present

## 2021-02-22 DIAGNOSIS — F419 Anxiety disorder, unspecified: Secondary | ICD-10-CM | POA: Diagnosis not present

## 2021-03-10 DIAGNOSIS — R350 Frequency of micturition: Secondary | ICD-10-CM | POA: Diagnosis not present

## 2021-03-10 DIAGNOSIS — M545 Low back pain, unspecified: Secondary | ICD-10-CM | POA: Diagnosis not present

## 2021-03-10 DIAGNOSIS — E1165 Type 2 diabetes mellitus with hyperglycemia: Secondary | ICD-10-CM | POA: Diagnosis not present

## 2021-03-10 DIAGNOSIS — Z299 Encounter for prophylactic measures, unspecified: Secondary | ICD-10-CM | POA: Diagnosis not present

## 2021-03-10 DIAGNOSIS — I10 Essential (primary) hypertension: Secondary | ICD-10-CM | POA: Diagnosis not present

## 2021-03-10 DIAGNOSIS — E1142 Type 2 diabetes mellitus with diabetic polyneuropathy: Secondary | ICD-10-CM | POA: Diagnosis not present

## 2021-03-15 DIAGNOSIS — F419 Anxiety disorder, unspecified: Secondary | ICD-10-CM | POA: Diagnosis not present

## 2021-03-22 DIAGNOSIS — F419 Anxiety disorder, unspecified: Secondary | ICD-10-CM | POA: Diagnosis not present

## 2021-03-29 DIAGNOSIS — F419 Anxiety disorder, unspecified: Secondary | ICD-10-CM | POA: Diagnosis not present

## 2021-04-05 DIAGNOSIS — F419 Anxiety disorder, unspecified: Secondary | ICD-10-CM | POA: Diagnosis not present

## 2021-04-12 DIAGNOSIS — F419 Anxiety disorder, unspecified: Secondary | ICD-10-CM | POA: Diagnosis not present

## 2021-04-19 DIAGNOSIS — F419 Anxiety disorder, unspecified: Secondary | ICD-10-CM | POA: Diagnosis not present

## 2021-04-26 DIAGNOSIS — F419 Anxiety disorder, unspecified: Secondary | ICD-10-CM | POA: Diagnosis not present

## 2021-05-03 DIAGNOSIS — F419 Anxiety disorder, unspecified: Secondary | ICD-10-CM | POA: Diagnosis not present

## 2021-05-10 DIAGNOSIS — F419 Anxiety disorder, unspecified: Secondary | ICD-10-CM | POA: Diagnosis not present

## 2021-05-17 DIAGNOSIS — F419 Anxiety disorder, unspecified: Secondary | ICD-10-CM | POA: Diagnosis not present

## 2021-05-31 DIAGNOSIS — F419 Anxiety disorder, unspecified: Secondary | ICD-10-CM | POA: Diagnosis not present

## 2021-06-07 DIAGNOSIS — F419 Anxiety disorder, unspecified: Secondary | ICD-10-CM | POA: Diagnosis not present

## 2021-06-14 DIAGNOSIS — F419 Anxiety disorder, unspecified: Secondary | ICD-10-CM | POA: Diagnosis not present

## 2021-06-16 DIAGNOSIS — E1165 Type 2 diabetes mellitus with hyperglycemia: Secondary | ICD-10-CM | POA: Diagnosis not present

## 2021-06-16 DIAGNOSIS — I10 Essential (primary) hypertension: Secondary | ICD-10-CM | POA: Diagnosis not present

## 2021-06-16 DIAGNOSIS — R809 Proteinuria, unspecified: Secondary | ICD-10-CM | POA: Diagnosis not present

## 2021-06-16 DIAGNOSIS — E1142 Type 2 diabetes mellitus with diabetic polyneuropathy: Secondary | ICD-10-CM | POA: Diagnosis not present

## 2021-06-16 DIAGNOSIS — Z299 Encounter for prophylactic measures, unspecified: Secondary | ICD-10-CM | POA: Diagnosis not present

## 2021-06-16 DIAGNOSIS — E1129 Type 2 diabetes mellitus with other diabetic kidney complication: Secondary | ICD-10-CM | POA: Diagnosis not present

## 2021-06-21 DIAGNOSIS — F419 Anxiety disorder, unspecified: Secondary | ICD-10-CM | POA: Diagnosis not present

## 2021-06-28 DIAGNOSIS — F419 Anxiety disorder, unspecified: Secondary | ICD-10-CM | POA: Diagnosis not present

## 2021-07-12 DIAGNOSIS — F419 Anxiety disorder, unspecified: Secondary | ICD-10-CM | POA: Diagnosis not present

## 2021-07-13 DIAGNOSIS — Z20822 Contact with and (suspected) exposure to covid-19: Secondary | ICD-10-CM | POA: Diagnosis not present

## 2021-07-19 DIAGNOSIS — F419 Anxiety disorder, unspecified: Secondary | ICD-10-CM | POA: Diagnosis not present

## 2021-07-26 DIAGNOSIS — F419 Anxiety disorder, unspecified: Secondary | ICD-10-CM | POA: Diagnosis not present

## 2021-08-02 DIAGNOSIS — F419 Anxiety disorder, unspecified: Secondary | ICD-10-CM | POA: Diagnosis not present

## 2021-08-09 DIAGNOSIS — F419 Anxiety disorder, unspecified: Secondary | ICD-10-CM | POA: Diagnosis not present

## 2021-08-23 DIAGNOSIS — F419 Anxiety disorder, unspecified: Secondary | ICD-10-CM | POA: Diagnosis not present

## 2021-08-30 DIAGNOSIS — F419 Anxiety disorder, unspecified: Secondary | ICD-10-CM | POA: Diagnosis not present

## 2021-09-13 DIAGNOSIS — F419 Anxiety disorder, unspecified: Secondary | ICD-10-CM | POA: Diagnosis not present

## 2021-09-20 DIAGNOSIS — M5441 Lumbago with sciatica, right side: Secondary | ICD-10-CM | POA: Diagnosis not present

## 2021-09-20 DIAGNOSIS — G8929 Other chronic pain: Secondary | ICD-10-CM | POA: Diagnosis not present

## 2021-09-22 DIAGNOSIS — E1165 Type 2 diabetes mellitus with hyperglycemia: Secondary | ICD-10-CM | POA: Diagnosis not present

## 2021-09-22 DIAGNOSIS — I700 Atherosclerosis of aorta: Secondary | ICD-10-CM | POA: Diagnosis not present

## 2021-09-22 DIAGNOSIS — I10 Essential (primary) hypertension: Secondary | ICD-10-CM | POA: Diagnosis not present

## 2021-09-22 DIAGNOSIS — Z299 Encounter for prophylactic measures, unspecified: Secondary | ICD-10-CM | POA: Diagnosis not present

## 2021-09-22 DIAGNOSIS — M545 Low back pain, unspecified: Secondary | ICD-10-CM | POA: Diagnosis not present

## 2021-09-27 DIAGNOSIS — F419 Anxiety disorder, unspecified: Secondary | ICD-10-CM | POA: Diagnosis not present

## 2021-10-04 DIAGNOSIS — F419 Anxiety disorder, unspecified: Secondary | ICD-10-CM | POA: Diagnosis not present

## 2021-10-11 DIAGNOSIS — F419 Anxiety disorder, unspecified: Secondary | ICD-10-CM | POA: Diagnosis not present

## 2021-10-18 DIAGNOSIS — F419 Anxiety disorder, unspecified: Secondary | ICD-10-CM | POA: Diagnosis not present

## 2021-10-19 DIAGNOSIS — Z299 Encounter for prophylactic measures, unspecified: Secondary | ICD-10-CM | POA: Diagnosis not present

## 2021-10-19 DIAGNOSIS — I700 Atherosclerosis of aorta: Secondary | ICD-10-CM | POA: Diagnosis not present

## 2021-10-19 DIAGNOSIS — Z0000 Encounter for general adult medical examination without abnormal findings: Secondary | ICD-10-CM | POA: Diagnosis not present

## 2021-10-19 DIAGNOSIS — Z1331 Encounter for screening for depression: Secondary | ICD-10-CM | POA: Diagnosis not present

## 2021-10-19 DIAGNOSIS — Z1339 Encounter for screening examination for other mental health and behavioral disorders: Secondary | ICD-10-CM | POA: Diagnosis not present

## 2021-10-19 DIAGNOSIS — E7800 Pure hypercholesterolemia, unspecified: Secondary | ICD-10-CM | POA: Diagnosis not present

## 2021-10-19 DIAGNOSIS — Z79899 Other long term (current) drug therapy: Secondary | ICD-10-CM | POA: Diagnosis not present

## 2021-10-19 DIAGNOSIS — Z6827 Body mass index (BMI) 27.0-27.9, adult: Secondary | ICD-10-CM | POA: Diagnosis not present

## 2021-10-19 DIAGNOSIS — I10 Essential (primary) hypertension: Secondary | ICD-10-CM | POA: Diagnosis not present

## 2021-10-19 DIAGNOSIS — Z7189 Other specified counseling: Secondary | ICD-10-CM | POA: Diagnosis not present

## 2021-10-19 DIAGNOSIS — R5383 Other fatigue: Secondary | ICD-10-CM | POA: Diagnosis not present

## 2021-10-27 DIAGNOSIS — M545 Low back pain, unspecified: Secondary | ICD-10-CM | POA: Diagnosis not present

## 2021-10-27 DIAGNOSIS — M25562 Pain in left knee: Secondary | ICD-10-CM | POA: Diagnosis not present

## 2021-10-27 DIAGNOSIS — R293 Abnormal posture: Secondary | ICD-10-CM | POA: Diagnosis not present

## 2021-10-27 DIAGNOSIS — M25561 Pain in right knee: Secondary | ICD-10-CM | POA: Diagnosis not present

## 2021-10-27 DIAGNOSIS — M6281 Muscle weakness (generalized): Secondary | ICD-10-CM | POA: Diagnosis not present

## 2021-10-30 DIAGNOSIS — R293 Abnormal posture: Secondary | ICD-10-CM | POA: Diagnosis not present

## 2021-10-30 DIAGNOSIS — M25561 Pain in right knee: Secondary | ICD-10-CM | POA: Diagnosis not present

## 2021-10-30 DIAGNOSIS — M25562 Pain in left knee: Secondary | ICD-10-CM | POA: Diagnosis not present

## 2021-10-30 DIAGNOSIS — M545 Low back pain, unspecified: Secondary | ICD-10-CM | POA: Diagnosis not present

## 2021-10-30 DIAGNOSIS — M6281 Muscle weakness (generalized): Secondary | ICD-10-CM | POA: Diagnosis not present

## 2021-11-01 DIAGNOSIS — F419 Anxiety disorder, unspecified: Secondary | ICD-10-CM | POA: Diagnosis not present

## 2021-11-08 DIAGNOSIS — R293 Abnormal posture: Secondary | ICD-10-CM | POA: Diagnosis not present

## 2021-11-08 DIAGNOSIS — M545 Low back pain, unspecified: Secondary | ICD-10-CM | POA: Diagnosis not present

## 2021-11-08 DIAGNOSIS — M25561 Pain in right knee: Secondary | ICD-10-CM | POA: Diagnosis not present

## 2021-11-08 DIAGNOSIS — M25562 Pain in left knee: Secondary | ICD-10-CM | POA: Diagnosis not present

## 2021-11-08 DIAGNOSIS — M6281 Muscle weakness (generalized): Secondary | ICD-10-CM | POA: Diagnosis not present

## 2021-11-15 DIAGNOSIS — F419 Anxiety disorder, unspecified: Secondary | ICD-10-CM | POA: Diagnosis not present

## 2021-11-17 DIAGNOSIS — R293 Abnormal posture: Secondary | ICD-10-CM | POA: Diagnosis not present

## 2021-11-17 DIAGNOSIS — M25561 Pain in right knee: Secondary | ICD-10-CM | POA: Diagnosis not present

## 2021-11-17 DIAGNOSIS — M6281 Muscle weakness (generalized): Secondary | ICD-10-CM | POA: Diagnosis not present

## 2021-11-17 DIAGNOSIS — M545 Low back pain, unspecified: Secondary | ICD-10-CM | POA: Diagnosis not present

## 2021-11-17 DIAGNOSIS — M25562 Pain in left knee: Secondary | ICD-10-CM | POA: Diagnosis not present

## 2021-11-22 DIAGNOSIS — R293 Abnormal posture: Secondary | ICD-10-CM | POA: Diagnosis not present

## 2021-11-22 DIAGNOSIS — M25561 Pain in right knee: Secondary | ICD-10-CM | POA: Diagnosis not present

## 2021-11-22 DIAGNOSIS — M6281 Muscle weakness (generalized): Secondary | ICD-10-CM | POA: Diagnosis not present

## 2021-11-22 DIAGNOSIS — M545 Low back pain, unspecified: Secondary | ICD-10-CM | POA: Diagnosis not present

## 2021-11-22 DIAGNOSIS — M25562 Pain in left knee: Secondary | ICD-10-CM | POA: Diagnosis not present

## 2021-11-28 DIAGNOSIS — M6281 Muscle weakness (generalized): Secondary | ICD-10-CM | POA: Diagnosis not present

## 2021-11-28 DIAGNOSIS — R293 Abnormal posture: Secondary | ICD-10-CM | POA: Diagnosis not present

## 2021-11-28 DIAGNOSIS — M545 Low back pain, unspecified: Secondary | ICD-10-CM | POA: Diagnosis not present

## 2021-11-28 DIAGNOSIS — M25562 Pain in left knee: Secondary | ICD-10-CM | POA: Diagnosis not present

## 2021-11-28 DIAGNOSIS — M25561 Pain in right knee: Secondary | ICD-10-CM | POA: Diagnosis not present

## 2021-12-06 DIAGNOSIS — M25561 Pain in right knee: Secondary | ICD-10-CM | POA: Diagnosis not present

## 2021-12-06 DIAGNOSIS — M6281 Muscle weakness (generalized): Secondary | ICD-10-CM | POA: Diagnosis not present

## 2021-12-06 DIAGNOSIS — M25562 Pain in left knee: Secondary | ICD-10-CM | POA: Diagnosis not present

## 2021-12-06 DIAGNOSIS — R293 Abnormal posture: Secondary | ICD-10-CM | POA: Diagnosis not present

## 2021-12-06 DIAGNOSIS — M545 Low back pain, unspecified: Secondary | ICD-10-CM | POA: Diagnosis not present

## 2021-12-10 DIAGNOSIS — M25561 Pain in right knee: Secondary | ICD-10-CM | POA: Diagnosis not present

## 2021-12-10 DIAGNOSIS — M545 Low back pain, unspecified: Secondary | ICD-10-CM | POA: Diagnosis not present

## 2021-12-10 DIAGNOSIS — M25562 Pain in left knee: Secondary | ICD-10-CM | POA: Diagnosis not present

## 2021-12-10 DIAGNOSIS — M6281 Muscle weakness (generalized): Secondary | ICD-10-CM | POA: Diagnosis not present

## 2021-12-10 DIAGNOSIS — R293 Abnormal posture: Secondary | ICD-10-CM | POA: Diagnosis not present

## 2021-12-27 DIAGNOSIS — F419 Anxiety disorder, unspecified: Secondary | ICD-10-CM | POA: Diagnosis not present

## 2021-12-29 DIAGNOSIS — R32 Unspecified urinary incontinence: Secondary | ICD-10-CM | POA: Diagnosis not present

## 2021-12-29 DIAGNOSIS — I10 Essential (primary) hypertension: Secondary | ICD-10-CM | POA: Diagnosis not present

## 2021-12-29 DIAGNOSIS — Z299 Encounter for prophylactic measures, unspecified: Secondary | ICD-10-CM | POA: Diagnosis not present

## 2021-12-29 DIAGNOSIS — E1165 Type 2 diabetes mellitus with hyperglycemia: Secondary | ICD-10-CM | POA: Diagnosis not present

## 2021-12-29 DIAGNOSIS — R350 Frequency of micturition: Secondary | ICD-10-CM | POA: Diagnosis not present

## 2021-12-29 DIAGNOSIS — Z2821 Immunization not carried out because of patient refusal: Secondary | ICD-10-CM | POA: Diagnosis not present

## 2022-01-03 DIAGNOSIS — F419 Anxiety disorder, unspecified: Secondary | ICD-10-CM | POA: Diagnosis not present

## 2022-01-10 DIAGNOSIS — F419 Anxiety disorder, unspecified: Secondary | ICD-10-CM | POA: Diagnosis not present

## 2022-01-19 DIAGNOSIS — M25561 Pain in right knee: Secondary | ICD-10-CM | POA: Diagnosis not present

## 2022-01-31 DIAGNOSIS — F419 Anxiety disorder, unspecified: Secondary | ICD-10-CM | POA: Diagnosis not present

## 2022-02-07 DIAGNOSIS — F419 Anxiety disorder, unspecified: Secondary | ICD-10-CM | POA: Diagnosis not present

## 2022-02-14 DIAGNOSIS — F419 Anxiety disorder, unspecified: Secondary | ICD-10-CM | POA: Diagnosis not present

## 2022-02-18 IMAGING — US US THYROID
1 series · 13 of 25 positions shown · non-contrast
Comparison: 09/26/2017, 09/17/2016

CLINICAL DATA: 69-year-old female with a history of thyroid nodule
follow-up

EXAM:
THYROID ULTRASOUND
TECHNIQUE: Ultrasound examination of the thyroid gland and adjacent soft
tissues was performed.

[Series 1: us thyroid · 13 of 71 slices shown]
[im 1/71]
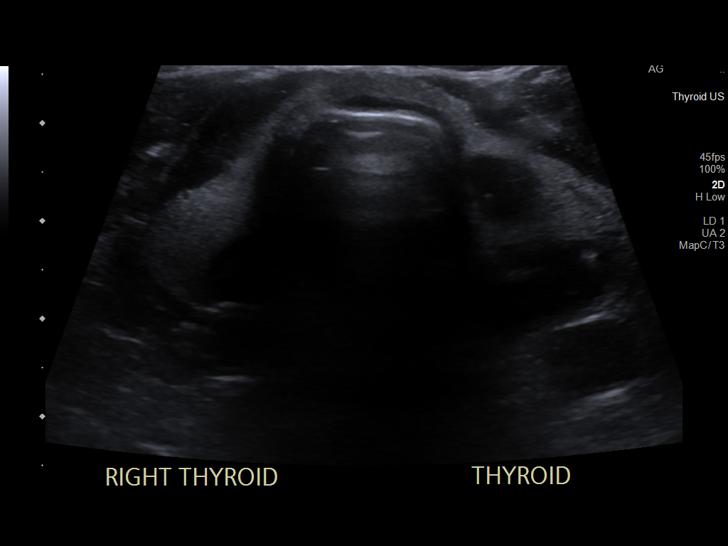
[im 6/71]
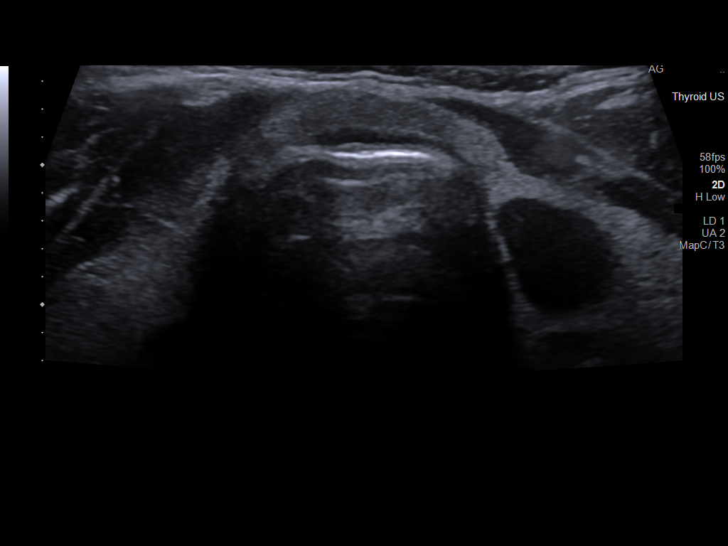
[im 12/71]
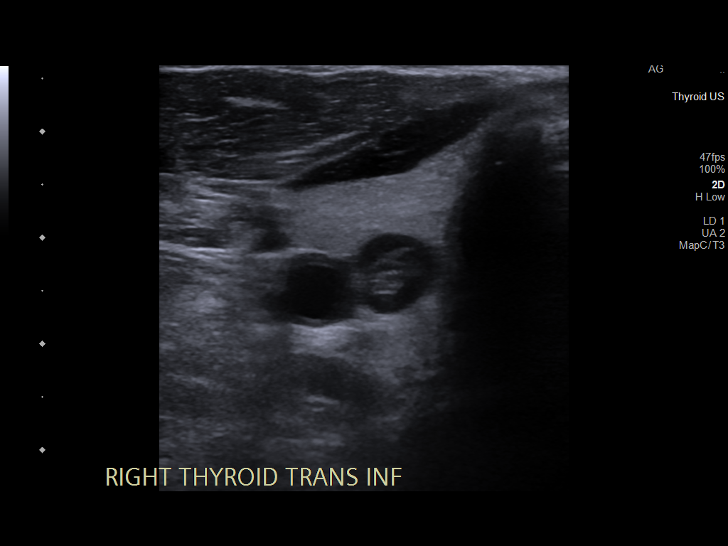
[im 18/71]
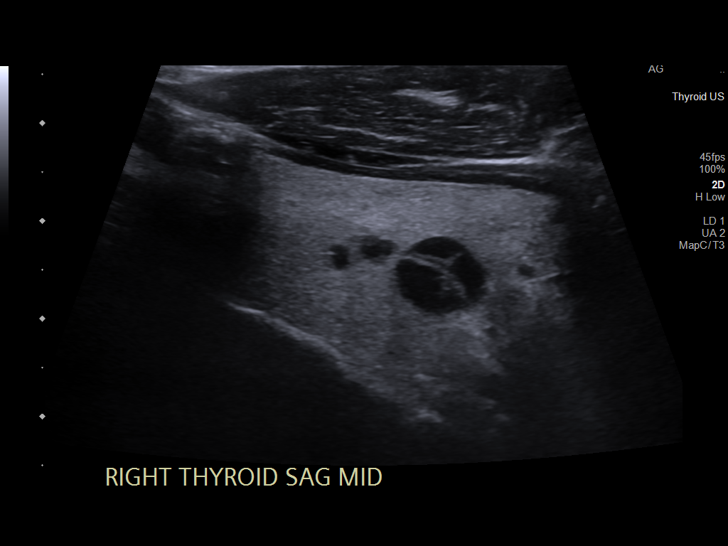
[im 24/71]
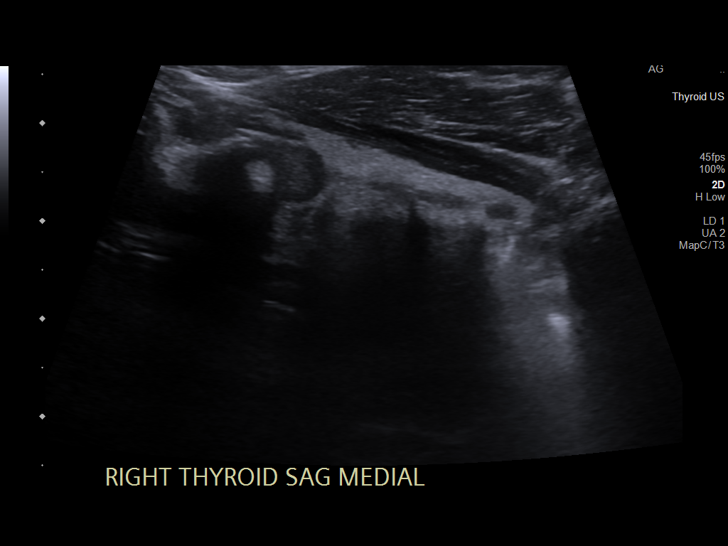
[im 30/71]
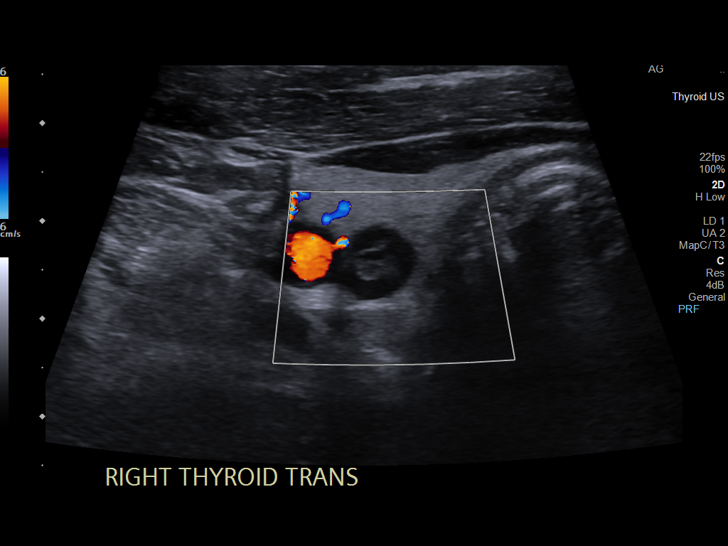
[im 36/71]
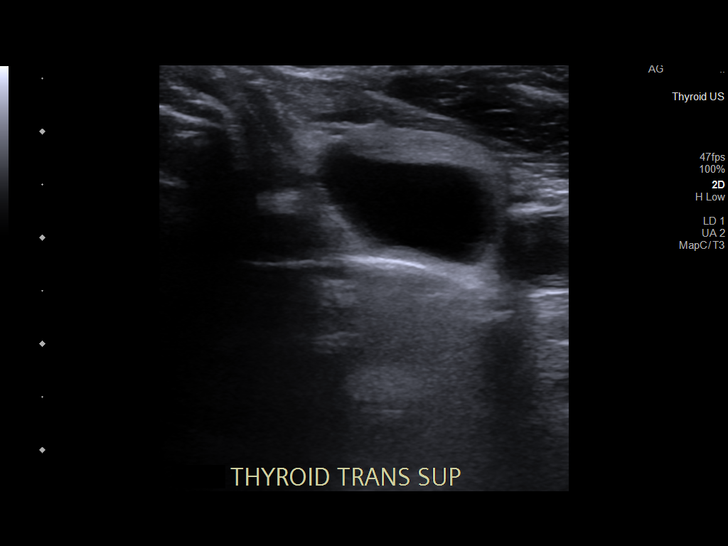
[im 41/71]
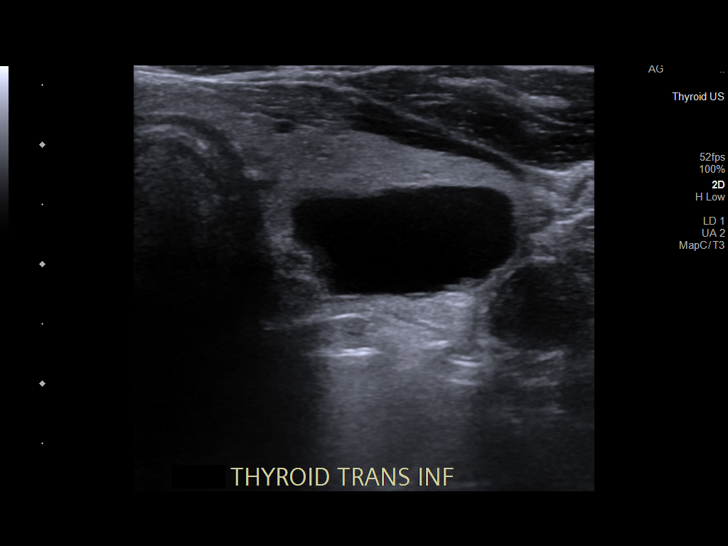
[im 47/71]
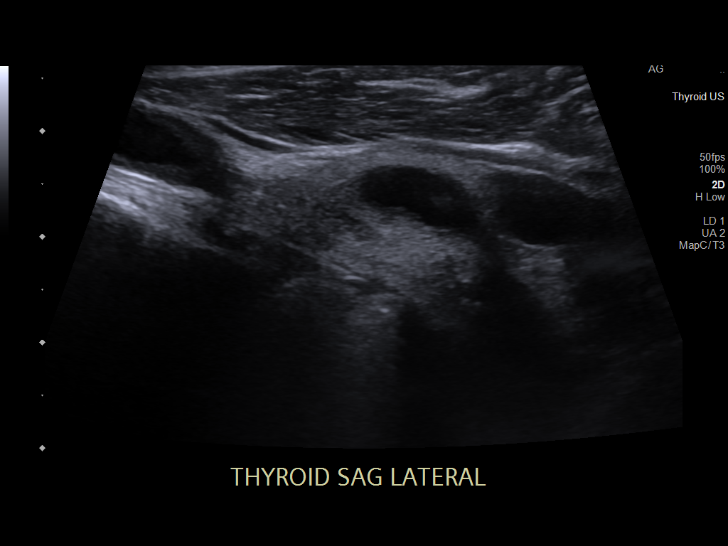
[im 53/71]
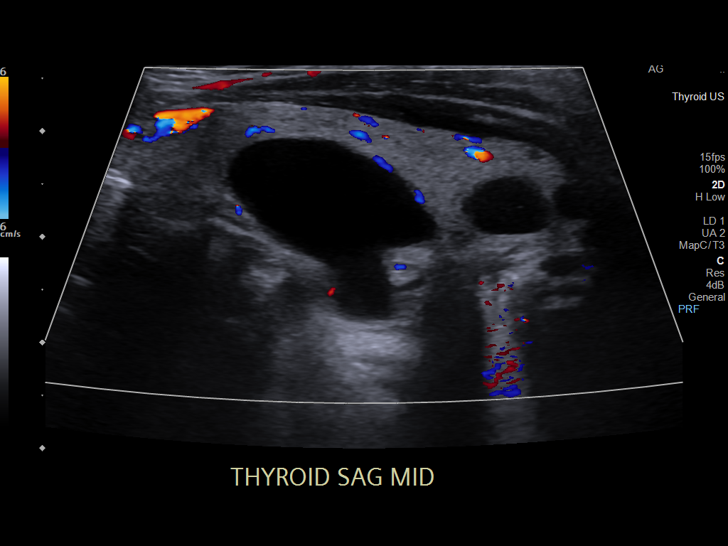
[im 59/71]
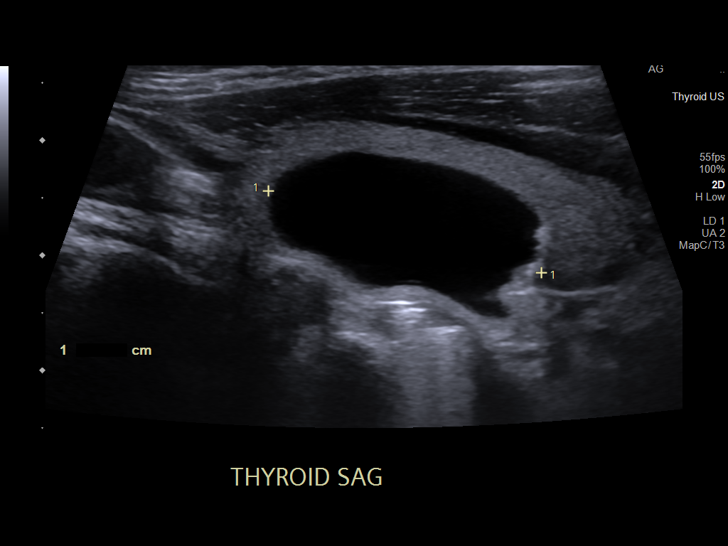
[im 65/71]
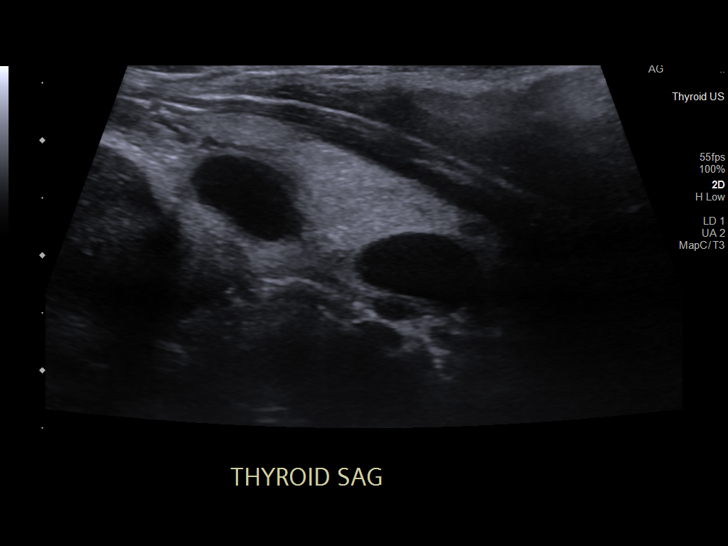
[im 71/71]
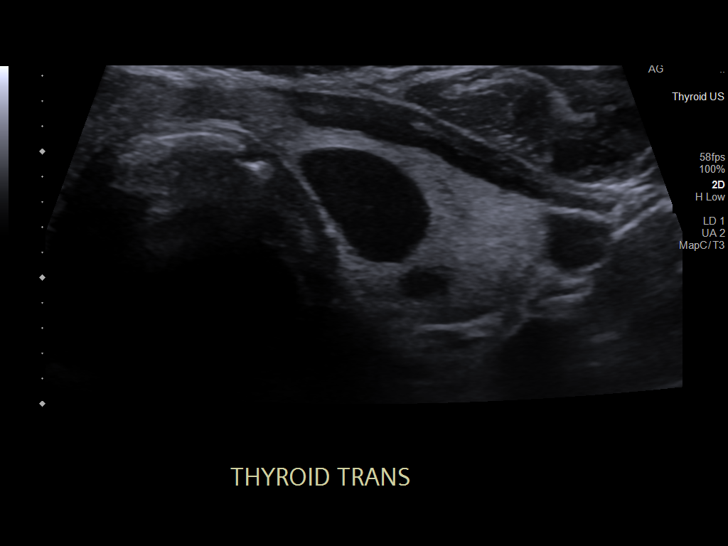

[13 of 25 positions shown; findings below may reference images not displayed]

FINDINGS: Parenchymal Echotexture: Normal

Isthmus: 0.3 cm

Right lobe: 3.4 cm x 2.0 cm x 1.8 cm

Left lobe: 4.7 cm x 2.0 cm x 2.4 cm

_________________________________________________________

Estimated total number of nodules >/= 1 cm: 3

Number of spongiform nodules >/=  2 cm not described below (TR1): 0

Number of mixed cystic and solid nodules >/= 1.5 cm not described
below (TR2): 0

_________________________________________________________

Nodule # 2:

Location: Right; Mid

Maximum size: 1.0 cm; Other 2 dimensions: 0.9 cm x 0.8 cm

Composition: spongiform (0)

ACR TI-RADS recommendations:

Spongiform nodule does not meet criteria for surveillance or biopsy

_________________________________________________________

Nodule # 3:

Location: Left; Inferior

Maximum size: 1.2 cm; Other 2 dimensions: 1.0 cm x 1.0 cm

Composition: cystic/almost completely cystic (0)

Echogenicity: anechoic (0)

Shape: not taller-than-wide (0)

Margins: smooth (0)

Echogenic foci: none (0)

ACR TI-RADS total points: 0.

ACR TI-RADS risk category: TR1 (0-1 points).

ACR TI-RADS recommendations:

Cystic nodule does not meet criteria for surveillance or biopsy

_________________________________________________________

Nodule labeled 1 in the superior left thyroid, 2.5 cm. This is
relatively unchanged from prior, with TR 1 characteristics. Nodule
does not meet criteria for surveillance.

No adenopathy
IMPRESSION: No thyroid nodule meets criteria for biopsy or surveillance, as
designated by the newly established ACR TI-RADS criteria.

Recommendations follow those established by the new ACR TI-RADS
criteria ([HOSPITAL] 5467;[DATE]).

## 2022-03-21 DIAGNOSIS — F419 Anxiety disorder, unspecified: Secondary | ICD-10-CM | POA: Diagnosis not present

## 2022-03-28 DIAGNOSIS — F419 Anxiety disorder, unspecified: Secondary | ICD-10-CM | POA: Diagnosis not present

## 2022-04-04 DIAGNOSIS — F419 Anxiety disorder, unspecified: Secondary | ICD-10-CM | POA: Diagnosis not present

## 2022-04-11 DIAGNOSIS — F419 Anxiety disorder, unspecified: Secondary | ICD-10-CM | POA: Diagnosis not present

## 2022-04-13 DIAGNOSIS — I700 Atherosclerosis of aorta: Secondary | ICD-10-CM | POA: Diagnosis not present

## 2022-04-13 DIAGNOSIS — R3589 Other polyuria: Secondary | ICD-10-CM | POA: Diagnosis not present

## 2022-04-13 DIAGNOSIS — Z299 Encounter for prophylactic measures, unspecified: Secondary | ICD-10-CM | POA: Diagnosis not present

## 2022-04-13 DIAGNOSIS — I10 Essential (primary) hypertension: Secondary | ICD-10-CM | POA: Diagnosis not present

## 2022-04-13 DIAGNOSIS — E1142 Type 2 diabetes mellitus with diabetic polyneuropathy: Secondary | ICD-10-CM | POA: Diagnosis not present

## 2022-04-13 DIAGNOSIS — E1165 Type 2 diabetes mellitus with hyperglycemia: Secondary | ICD-10-CM | POA: Diagnosis not present

## 2022-04-18 DIAGNOSIS — F419 Anxiety disorder, unspecified: Secondary | ICD-10-CM | POA: Diagnosis not present

## 2022-04-25 DIAGNOSIS — F419 Anxiety disorder, unspecified: Secondary | ICD-10-CM | POA: Diagnosis not present

## 2022-05-02 DIAGNOSIS — F419 Anxiety disorder, unspecified: Secondary | ICD-10-CM | POA: Diagnosis not present

## 2022-05-09 DIAGNOSIS — F419 Anxiety disorder, unspecified: Secondary | ICD-10-CM | POA: Diagnosis not present

## 2022-05-16 DIAGNOSIS — F419 Anxiety disorder, unspecified: Secondary | ICD-10-CM | POA: Diagnosis not present

## 2022-05-23 DIAGNOSIS — F419 Anxiety disorder, unspecified: Secondary | ICD-10-CM | POA: Diagnosis not present

## 2022-05-30 DIAGNOSIS — F419 Anxiety disorder, unspecified: Secondary | ICD-10-CM | POA: Diagnosis not present

## 2022-06-06 DIAGNOSIS — F419 Anxiety disorder, unspecified: Secondary | ICD-10-CM | POA: Diagnosis not present

## 2022-06-12 ENCOUNTER — Encounter: Payer: Self-pay | Admitting: Neurology

## 2022-06-13 ENCOUNTER — Telehealth: Payer: Self-pay | Admitting: Neurology

## 2022-06-13 ENCOUNTER — Encounter: Payer: Self-pay | Admitting: Neurology

## 2022-06-13 ENCOUNTER — Ambulatory Visit (INDEPENDENT_AMBULATORY_CARE_PROVIDER_SITE_OTHER): Payer: Medicare Other | Admitting: Neurology

## 2022-06-13 VITALS — BP 162/80 | HR 76 | Ht 60.0 in | Wt 135.5 lb

## 2022-06-13 DIAGNOSIS — G3184 Mild cognitive impairment, so stated: Secondary | ICD-10-CM

## 2022-06-13 MED ORDER — DONEPEZIL HCL 5 MG PO TABS: 5.0000 mg | ORAL_TABLET | Freq: Every day | ORAL | 11 refills | Status: DC

## 2022-06-13 NOTE — Telephone Encounter (Signed)
Referral sent to Dr. Rodenbough, phone # [phone]. 

## 2022-06-13 NOTE — Progress Notes (Unsigned)
[facility]  PATIENT: Michelle Case DOB: 1951/03/04  REQUESTING CLINICIAN: Ignatius Specking, MD HISTORY FROM: Patient, friend Dorinda  REASON FOR VISIT: Memory loss    HISTORICAL  CHIEF COMPLAINT:  Chief Complaint  Patient presents with   New Patient (Initial Visit)    Rm 14. Patient with friend and service dog. Memory problems complaints of last two years    HISTORY OF PRESENT ILLNESS:  This is a 72 year old woman past medical history of hypertension, diabetes mellitus, depression who is presenting with memory loss.  Patient reports memory loss has been going on for the past 3 years since COVID.  She reports during COVID she lost her sister, she lost other relative and since then she noted that her memory has been deteriorating.  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  She is accompanied by her friend Nettie Elm who is also her helper.  Dorinda reports that patient tends to repeat herself, she is forgetful, she needs reminders.  She does help her with appointments.  And also remind her regarding her bills.  She does not drive. She reports that her life changed since her husband died in 10/04/2013she was able to function but since COVID it got worse.   TBI:    No past history of TBI Stroke:   no past history of stroke Seizures:   no past history of seizures Sleep:  no history of sleep apnea.  Mood:  Depression, never took medicine  Family history of Dementia:  Denies  Functional status: independent in most ADLs and IADLs Patient lives alone. Cooking: yes, but do a lot microwave food  Cleaning: patient but limited by pain  Shopping: with helper  Bathing: patient  Toileting: patient  Driving: Not since husband died  Bills: Patient   Ever left the stove on by accident?: denies  Forget how to use items around the house?: denies  Getting lost going to familiar places?: denies   Forgetting loved ones names?: Denies  Word finding difficulty? Yes  Sleep: Disrupted sleep, wakes up multiple time per night    OTHER MEDICAL CONDITIONS: Diabetes, Depression, Hypertension   ALLERGIES: No Known Allergies  HOME MEDICATIONS: Outpatient Medications Prior to Visit  Medication Sig Dispense Refill   aspirin 81 MG chewable tablet Chew 81 mg by mouth daily.     baclofen (LIORESAL) 10 MG tablet Take 10 mg by mouth 3 (three) times daily.     diclofenac (VOLTAREN) 75 MG EC tablet Take 1 tablet (75 mg total) by mouth 2 (two) times daily.     diclofenac sodium (VOLTAREN) 1 % GEL Apply 2 g topically daily as needed (muscle soreness).     glyBURIDE-metformin (GLUCOVANCE) 5-500 MG per tablet Take 1 tablet by mouth daily with breakfast.      methocarbamol (ROBAXIN) 500 MG tablet Take 500 mg by mouth 2 (two) times daily.     mirabegron ER (MYRBETRIQ) 25 MG TB24 tablet Take 25 mg by mouth daily.     ramipril (ALTACE) 5 MG capsule Take 5 mg by mouth daily.     rosuvastatin (CRESTOR) 10 MG tablet Take 10 mg by mouth daily.     Coenzyme Q10 100 MG TABS Take 500 mg by mouth daily. (Patient not taking: Reported on 06/13/2022)     No facility-administered medications prior to visit.    PAST MEDICAL HISTORY: Past Medical History:  Diagnosis  Date   Cervical pain (neck)    Degenerative disk disease    Degenerative joint disease    Diabetes mellitus    GERD (gastroesophageal reflux disease)    Hyperlipidemia    "managing with diet"   Hypertension    Mitral valve prolapse    states hx of   Neuropathy    Thyroid cyst     PAST SURGICAL HISTORY: Past Surgical History:  Procedure Laterality Date   ABDOMINAL HYSTERECTOMY     ANTERIOR CERVICAL CORPECTOMY  01/08/2012   Procedure: ANTERIOR CERVICAL CORPECTOMY;  Surgeon: Barnett AbuHenry Elsner, MD;  Location: [facility];  Service: Neurosurgery;  Laterality: N/A;  Cervical five-six Corpectomy, Cervical four-seven Arthrodesis   COLONOSCOPY N/A  11/06/2017   Procedure: COLONOSCOPY;  Surgeon: Malissa Hippoehman, Najeeb U, MD;  Location: [facility];  Service: Endoscopy;  Laterality: N/A;  100   colonscopy     polyp- cancer in situ   HAND SURGERY     right hand   HAND SURGERY     right   LAPAROSCOPIC TOTAL HYSTERECTOMY  1990   POLYPECTOMY  11/06/2017   Procedure: POLYPECTOMY;  Surgeon: Malissa Hippoehman, Najeeb U, MD;  Location: [facility];  Service: Endoscopy;;  colon    FAMILY HISTORY: Family History  Problem Relation Age of Onset   Stroke Mother    Stroke Father    Stroke Sister    Stroke Brother    Heart disease Brother     SOCIAL HISTORY: Social History   Socioeconomic History   Marital status: Widowed    Spouse name: Not on file   Number of children: Not on file   Years of education: Not on file   Highest education level: Not on file  Occupational History   Not on file  Tobacco Use   Smoking status: Former   Smokeless tobacco: Never   Tobacco comments:    stopped 1960's  Substance and Sexual Activity   Alcohol use: No   Drug use: No   Sexual activity: Yes  Other Topics Concern   Not on file  Social History Narrative   Not on file   Social Determinants of Health   Financial Resource Strain: Not on file  Food Insecurity: Not on file  Transportation Needs: Not on file  Physical Activity: Not on file  Stress: Not on file  Social Connections: Not on file  Intimate Partner Violence: Not on file    PHYSICAL EXAM  GENERAL EXAM/CONSTITUTIONAL: Vitals:  Vitals:   06/13/22 1051  BP: (!) 162/80  Pulse: 76  Weight: 135 lb 8 oz (61.5 kg)  Height: 5' (1.524 m)   Body mass index is 26.46 kg/m. Wt Readings from Last 3 Encounters:  06/13/22 135 lb 8 oz (61.5 kg)  11/06/17 145 lb (65.8 kg)  01/07/12 155 lb (70.3 kg)   Patient is in no distress; well developed, nourished and groomed; neck is supple   EYES: Visual fields full to confrontation, Extraocular movements intacts,   MUSCULOSKELETAL: Gait, strength,  tone, movements noted in Neurologic exam below  NEUROLOGIC: MENTAL STATUS:      No data to display            06/13/2022   10:53 AM  Montreal Cognitive Assessment   Visuospatial/ Executive (0/5) 2  Naming (0/3) 3  Attention: Read list of digits (0/2) 2  Attention: Read list of letters (0/1) 0  Attention: Serial 7 subtraction starting at 100 (0/3) 1  Language: Repeat phrase (0/2) 1  Language :  Fluency (0/1) 0  Abstraction (0/2) 2  Delayed Recall (0/5) 3  Orientation (0/6) 6  Total 20  Adjusted Score (based on education) 20     CRANIAL NERVE:  2nd, 3rd, 4th, 6th- visual fields full to confrontation, extraocular muscles intact, no nystagmus 5th - facial sensation symmetric 7th - facial strength symmetric 8th - hearing intact 9th - palate elevates symmetrically, uvula midline 11th - shoulder shrug symmetric 12th - tongue protrusion midline  MOTOR:  normal bulk and tone, full strength in the BUE, BLE  SENSORY:  normal and symmetric to light touch  COORDINATION:  finger-nose-finger, fine finger movements normal  GAIT/STATION:  Deferred     DIAGNOSTIC DATA (LABS, IMAGING, TESTING) - I reviewed patient records, labs, notes, testing and imaging myself where available.  Lab Results  Component Value Date   WBC 8.5 01/08/2012   HGB 13.1 01/08/2012   HCT 39.5 01/08/2012   MCV 82.6 01/08/2012   PLT 269 01/08/2012      Component Value Date/Time   NA 138 01/08/2012 0748   K 4.1 01/08/2012 0748   CL 100 01/08/2012 0748   CO2 26 01/08/2012 0748   GLUCOSE 119 (H) 01/08/2012 0748   BUN 12 01/08/2012 0748   CREATININE 0.60 01/08/2012 0748   CALCIUM 9.9 01/08/2012 0748   GFRNONAA >90 01/08/2012 0748   GFRAA >90 01/08/2012 0748   No results found for: "CHOL", "HDL", "LDLCALC", "LDLDIRECT", "TRIG", "CHOLHDL" No results found for: "HGBA1C" Lab Results  Component Value Date   VITAMINB12 >2000 (H) 06/13/2022   Lab Results  Component Value Date   TSH 1.170  06/13/2022      ASSESSMENT AND PLAN  72 y.o. year old female with history of hypertension, depression, diabetes who is presenting with memory decline for the past 4 years getting worse in the past 2, patient described memory impairment as being forgetful, needing reminders, she is accompanied with a friend who stated that she repeat herself, asked the same questions.  On exam today she scored a 20 out of 30 on the MoCA indicative of impairment.  At this time we will start her on Aricept, I will also obtain dementia labs including B12 TSH and ATN profile.  I will also send her for formal neuropsychological testing to rule out major cognitive impairment versus mild cognitive impairment versus underlying depression.  I will see her in 1 year for follow-up.  Will also start her on Aricept 5 mg nightly.   1. Mild cognitive impairment      Patient Instructions  Dementia labs including B12, TSH, and ATM profile MRI brain with and without contrast Referral for neuropsychological testing Start Aricept 5 mg nightly Continue other medications Discussed ways to reduce risk of dementia including exercise, keeping a good sleep good health good diet Follow-up in 1 year or sooner if worse   There are well-accepted and sensible ways to reduce risk for Alzheimers disease and other degenerative brain disorders .  Exercise Daily Walk A daily 20 minute walk should be part of your routine. Disease related apathy can be a significant roadblock to exercise and the only way to overcome this is to make it a daily routine and perhaps have a reward at the end (something your loved one loves to eat or drink perhaps) or a personal trainer coming to the home can also be very useful. Most importantly, the patient is much more likely to exercise if the caregiver / spouse does it with him/her. In general a structured,  repetitive schedule is best.  General Health: Any diseases which effect your body will effect your brain  such as a pneumonia, urinary infection, blood clot, heart attack or stroke. Keep contact with your primary care doctor for regular follow ups.  Sleep. A good nights sleep is healthy for the brain. Seven hours is recommended. If you have insomnia or poor sleep habits we can give you some instructions. If you have sleep apnea wear your mask.  Diet: Eating a heart healthy diet is also a good idea; fish and poultry instead of red meat, nuts (mostly non-peanuts), vegetables, fruits, olive oil or canola oil (instead of butter), minimal salt (use other spices to flavor foods), whole grain rice, bread, cereal and pasta and wine in moderation.Research is now showing that the MIND diet, which is a combination of The Mediterranean diet and the DASH diet, is beneficial for cognitive processing and longevity. Information about this diet can be found in The MIND Diet, a book by Alonna Minium, MS, RDN, and online at [employer].es  Finances, Power of [address] and Advance Directives: You should consider putting legal safeguards in place with regard to financial and medical decision making. While the spouse always has power of attorney for medical and financial issues in the absence of any form, you should consider what you want in case the spouse / caregiver is no longer around or capable of making decisions.    Orders Placed This Encounter  Procedures   MR BRAIN WO CONTRAST   TSH   Vitamin B12   ATN PROFILE   Ambulatory referral to Neuropsychology    Meds ordered this encounter  Medications   donepezil (ARICEPT) 5 MG tablet    Sig: Take 1 tablet (5 mg total) by mouth at bedtime.    Dispense:  30 tablet    Refill:  11    Return in about 1 year (around 06/13/2023).  I have spent a total of 65 minutes dedicated to this patient today, preparing to see patient, performing a medically appropriate examination and evaluation, ordering tests and/or medications and procedures, and  counseling and educating the patient/family/caregiver; independently interpreting result and communicating results to the family/patient/caregiver; and documenting clinical information in the electronic medical record.   Windell Norfolk, MD 06/14/2022, 8:47 AM  [facility] [address] [place], [place] [address] [phone]

## 2022-06-14 ENCOUNTER — Encounter: Payer: Self-pay | Admitting: Psychology

## 2022-06-14 ENCOUNTER — Telehealth: Payer: Self-pay | Admitting: Neurology

## 2022-06-14 ENCOUNTER — Encounter: Payer: Self-pay | Admitting: Neurology

## 2022-06-14 DIAGNOSIS — F419 Anxiety disorder, unspecified: Secondary | ICD-10-CM | POA: Diagnosis not present

## 2022-06-14 LAB — VITAMIN B12: Vitamin B-12: >2000 pg/mL — ABNORMAL HIGH (ref 232–1245)

## 2022-06-14 LAB — ATN PROFILE

## 2022-06-14 LAB — TSH: TSH: 1.17 u[IU]/mL (ref 0.450–4.500)

## 2022-06-14 MED ORDER — DONEPEZIL HCL 5 MG PO TABS: 5.0000 mg | ORAL_TABLET | Freq: Every day | ORAL | 3 refills | Status: DC

## 2022-06-14 NOTE — Telephone Encounter (Signed)
#  90 days supply sent.

## 2022-06-14 NOTE — Addendum Note (Signed)
Addended byWindell Norfolk on: 06/14/2022 02:02 PM   Modules accepted: Orders

## 2022-06-14 NOTE — Telephone Encounter (Signed)
Pt states re: her prescription plan she can get a 3 month supply at the same price of a 30 day supply for the donepezil (ARICEPT) 5 MG tablet .  Pt would like to know if Dr Teresa Coombs would be willing to redo her Rx so she would be able to get the 90 day supply at the cost of the 30 day.

## 2022-06-14 NOTE — Patient Instructions (Signed)
Dementia labs including B12, TSH, and ATM profile MRI brain with and without contrast Referral for neuropsychological testing Start Aricept 5 mg nightly Continue other medications Discussed ways to reduce risk of dementia including exercise, keeping a good sleep good health good diet Follow-up in 1 year or sooner if worse   There are well-accepted and sensible ways to reduce risk for Alzheimers disease and other degenerative brain disorders .  Exercise Daily Walk A daily 20 minute walk should be part of your routine. Disease related apathy can be a significant roadblock to exercise and the only way to overcome this is to make it a daily routine and perhaps have a reward at the end (something your loved one loves to eat or drink perhaps) or a personal trainer coming to the home can also be very useful. Most importantly, the patient is much more likely to exercise if the caregiver / spouse does it with him/her. In general a structured, repetitive schedule is best.  General Health: Any diseases which effect your body will effect your brain such as a pneumonia, urinary infection, blood clot, heart attack or stroke. Keep contact with your primary care doctor for regular follow ups.  Sleep. A good nights sleep is healthy for the brain. Seven hours is recommended. If you have insomnia or poor sleep habits we can give you some instructions. If you have sleep apnea wear your mask.  Diet: Eating a heart healthy diet is also a good idea; fish and poultry instead of red meat, nuts (mostly non-peanuts), vegetables, fruits, olive oil or canola oil (instead of butter), minimal salt (use other spices to flavor foods), whole grain rice, bread, cereal and pasta and wine in moderation.Research is now showing that the MIND diet, which is a combination of The Mediterranean diet and the DASH diet, is beneficial for cognitive processing and longevity. Information about this diet can be found in The MIND Diet, a book by  Alonna Minium, MS, RDN, and online at [employer]  Finances, Power of [address] and Advance Directives: You should consider putting legal safeguards in place with regard to financial and medical decision making. While the spouse always has power of attorney for medical and financial issues in the absence of any form, you should consider what you want in case the spouse / caregiver is no longer around or capable of making decisions.

## 2022-06-14 NOTE — Telephone Encounter (Signed)
Called pt informed her  90 days supply sent to pharmacy  Pt said, okay thank you.

## 2022-06-15 LAB — ATN PROFILE

## 2022-06-16 LAB — ATN PROFILE: Beta-amyloid 42: 15.25 pg/mL

## 2022-06-18 NOTE — Progress Notes (Signed)
Please call and advise the patient that the recent labs we checked showed evidence of Alzheimer disease biomarker's. Please advised her to continue current medications, to keep any upcoming appointments or tests and to call us with any interim questions, concerns, problems or updates. Thanks,   Windell Norfolk, MD

## 2022-06-20 DIAGNOSIS — F419 Anxiety disorder, unspecified: Secondary | ICD-10-CM | POA: Diagnosis not present

## 2022-06-21 ENCOUNTER — Telehealth: Payer: Self-pay | Admitting: Neurology

## 2022-06-21 NOTE — Telephone Encounter (Signed)
medicare/[place] NPR Case Number: [id number] sent to GI [phone]

## 2022-06-27 DIAGNOSIS — F419 Anxiety disorder, unspecified: Secondary | ICD-10-CM | POA: Diagnosis not present

## 2022-07-04 DIAGNOSIS — F419 Anxiety disorder, unspecified: Secondary | ICD-10-CM | POA: Diagnosis not present

## 2022-07-10 ENCOUNTER — Encounter: Payer: Self-pay | Admitting: Specialist

## 2022-07-11 DIAGNOSIS — F419 Anxiety disorder, unspecified: Secondary | ICD-10-CM | POA: Diagnosis not present

## 2022-07-13 ENCOUNTER — Ambulatory Visit
Admission: RE | Admit: 2022-07-13 | Discharge: 2022-07-13 | Disposition: A | Discharge status: Home / Self Care | Payer: Medicare Other | Source: Ambulatory Visit | Attending: Neurology | Admitting: Neurology

## 2022-07-13 DIAGNOSIS — G3184 Mild cognitive impairment, so stated: Secondary | ICD-10-CM

## 2022-07-16 ENCOUNTER — Telehealth: Payer: Self-pay

## 2022-07-16 NOTE — Progress Notes (Signed)
Please call and advise the patient that the recent scans we did were within normal limits. In particular, there were no acute findings, such as a stroke, or mass or blood products. No further action is required on this test at this time. Please remind patient to keep any upcoming appointments or tests and to call us with any interim questions, concerns, problems or updates. Thanks,   Windell Norfolk, MD

## 2022-07-16 NOTE — Telephone Encounter (Signed)
-----   Message from Windell Norfolk, MD sent at 07/16/2022  7:59 AM EDT ----- Please call and advise the patient that the recent scans we did were within normal limits. In particular, there were no acute findings, such as a stroke, or mass or blood products. No further action is required on this test at this time. Please remind patient to keep any upcoming appointments or tests and to call us with any interim questions, concerns, problems or updates. Thanks,   Windell Norfolk, MD

## 2022-07-16 NOTE — Telephone Encounter (Signed)
Call to patient to give MRI results. Patient appreciative  of call and request results be mailed to her. She is unable to use MyChart due to not using a computer. She also asks that she receive copy of her medical records from our office. Advised I would discuss with medical records. She reports pain all over and describes as arthritic pain  and neuropathy pain. Advised  to follow  up with primary care and I will let Dr. Teresa Coombs know as well. Patient appreciative of call.

## 2022-07-18 DIAGNOSIS — F419 Anxiety disorder, unspecified: Secondary | ICD-10-CM | POA: Diagnosis not present

## 2022-07-26 DIAGNOSIS — F419 Anxiety disorder, unspecified: Secondary | ICD-10-CM | POA: Diagnosis not present

## 2022-08-01 DIAGNOSIS — F419 Anxiety disorder, unspecified: Secondary | ICD-10-CM | POA: Diagnosis not present

## 2022-08-03 DIAGNOSIS — E1142 Type 2 diabetes mellitus with diabetic polyneuropathy: Secondary | ICD-10-CM | POA: Diagnosis not present

## 2022-08-03 DIAGNOSIS — I10 Essential (primary) hypertension: Secondary | ICD-10-CM | POA: Diagnosis not present

## 2022-08-03 DIAGNOSIS — Z299 Encounter for prophylactic measures, unspecified: Secondary | ICD-10-CM | POA: Diagnosis not present

## 2022-08-03 DIAGNOSIS — F0390 Unspecified dementia without behavioral disturbance: Secondary | ICD-10-CM | POA: Diagnosis not present

## 2022-08-08 DIAGNOSIS — F419 Anxiety disorder, unspecified: Secondary | ICD-10-CM | POA: Diagnosis not present

## 2022-08-15 DIAGNOSIS — F419 Anxiety disorder, unspecified: Secondary | ICD-10-CM | POA: Diagnosis not present

## 2022-08-22 DIAGNOSIS — F419 Anxiety disorder, unspecified: Secondary | ICD-10-CM | POA: Diagnosis not present

## 2022-08-29 DIAGNOSIS — F419 Anxiety disorder, unspecified: Secondary | ICD-10-CM | POA: Diagnosis not present

## 2022-09-05 DIAGNOSIS — F419 Anxiety disorder, unspecified: Secondary | ICD-10-CM | POA: Diagnosis not present

## 2022-09-12 DIAGNOSIS — F419 Anxiety disorder, unspecified: Secondary | ICD-10-CM | POA: Diagnosis not present

## 2022-09-19 DIAGNOSIS — F419 Anxiety disorder, unspecified: Secondary | ICD-10-CM | POA: Diagnosis not present

## 2022-09-26 DIAGNOSIS — F419 Anxiety disorder, unspecified: Secondary | ICD-10-CM | POA: Diagnosis not present

## 2022-10-03 DIAGNOSIS — F419 Anxiety disorder, unspecified: Secondary | ICD-10-CM | POA: Diagnosis not present

## 2022-10-17 DIAGNOSIS — F419 Anxiety disorder, unspecified: Secondary | ICD-10-CM | POA: Diagnosis not present

## 2022-10-18 ENCOUNTER — Encounter (INDEPENDENT_AMBULATORY_CARE_PROVIDER_SITE_OTHER): Payer: Self-pay | Admitting: *Deleted

## 2022-10-19 DIAGNOSIS — M159 Polyosteoarthritis, unspecified: Secondary | ICD-10-CM | POA: Diagnosis not present

## 2022-10-19 DIAGNOSIS — E1165 Type 2 diabetes mellitus with hyperglycemia: Secondary | ICD-10-CM | POA: Diagnosis not present

## 2022-10-19 DIAGNOSIS — I10 Essential (primary) hypertension: Secondary | ICD-10-CM | POA: Diagnosis not present

## 2022-10-19 DIAGNOSIS — R5383 Other fatigue: Secondary | ICD-10-CM | POA: Diagnosis not present

## 2022-10-19 DIAGNOSIS — Z79899 Other long term (current) drug therapy: Secondary | ICD-10-CM | POA: Diagnosis not present

## 2022-10-19 DIAGNOSIS — Z1339 Encounter for screening examination for other mental health and behavioral disorders: Secondary | ICD-10-CM | POA: Diagnosis not present

## 2022-10-19 DIAGNOSIS — Z299 Encounter for prophylactic measures, unspecified: Secondary | ICD-10-CM | POA: Diagnosis not present

## 2022-10-19 DIAGNOSIS — Z0000 Encounter for general adult medical examination without abnormal findings: Secondary | ICD-10-CM | POA: Diagnosis not present

## 2022-10-19 DIAGNOSIS — Z1331 Encounter for screening for depression: Secondary | ICD-10-CM | POA: Diagnosis not present

## 2022-10-19 DIAGNOSIS — F0390 Unspecified dementia without behavioral disturbance: Secondary | ICD-10-CM | POA: Diagnosis not present

## 2022-10-19 DIAGNOSIS — Z7189 Other specified counseling: Secondary | ICD-10-CM | POA: Diagnosis not present

## 2022-10-19 DIAGNOSIS — E7800 Pure hypercholesterolemia, unspecified: Secondary | ICD-10-CM | POA: Diagnosis not present

## 2022-10-24 DIAGNOSIS — F419 Anxiety disorder, unspecified: Secondary | ICD-10-CM | POA: Diagnosis not present

## 2022-10-31 DIAGNOSIS — F419 Anxiety disorder, unspecified: Secondary | ICD-10-CM | POA: Diagnosis not present

## 2022-11-07 DIAGNOSIS — F419 Anxiety disorder, unspecified: Secondary | ICD-10-CM | POA: Diagnosis not present

## 2022-11-22 DIAGNOSIS — F419 Anxiety disorder, unspecified: Secondary | ICD-10-CM | POA: Diagnosis not present

## 2022-11-28 DIAGNOSIS — F419 Anxiety disorder, unspecified: Secondary | ICD-10-CM | POA: Diagnosis not present

## 2022-12-12 DIAGNOSIS — F419 Anxiety disorder, unspecified: Secondary | ICD-10-CM | POA: Diagnosis not present

## 2022-12-19 DIAGNOSIS — F419 Anxiety disorder, unspecified: Secondary | ICD-10-CM | POA: Diagnosis not present

## 2023-01-02 DIAGNOSIS — F419 Anxiety disorder, unspecified: Secondary | ICD-10-CM | POA: Diagnosis not present

## 2023-01-09 DIAGNOSIS — F419 Anxiety disorder, unspecified: Secondary | ICD-10-CM | POA: Diagnosis not present

## 2023-01-16 DIAGNOSIS — F419 Anxiety disorder, unspecified: Secondary | ICD-10-CM | POA: Diagnosis not present

## 2023-01-23 DIAGNOSIS — F419 Anxiety disorder, unspecified: Secondary | ICD-10-CM | POA: Diagnosis not present

## 2023-02-06 DIAGNOSIS — F419 Anxiety disorder, unspecified: Secondary | ICD-10-CM | POA: Diagnosis not present

## 2023-02-13 DIAGNOSIS — F419 Anxiety disorder, unspecified: Secondary | ICD-10-CM | POA: Diagnosis not present

## 2023-02-20 DIAGNOSIS — F419 Anxiety disorder, unspecified: Secondary | ICD-10-CM | POA: Diagnosis not present

## 2023-03-13 DIAGNOSIS — F419 Anxiety disorder, unspecified: Secondary | ICD-10-CM | POA: Diagnosis not present

## 2023-03-20 DIAGNOSIS — F419 Anxiety disorder, unspecified: Secondary | ICD-10-CM | POA: Diagnosis not present

## 2023-03-27 DIAGNOSIS — F419 Anxiety disorder, unspecified: Secondary | ICD-10-CM | POA: Diagnosis not present

## 2023-04-03 DIAGNOSIS — F419 Anxiety disorder, unspecified: Secondary | ICD-10-CM | POA: Diagnosis not present

## 2023-04-10 DIAGNOSIS — F419 Anxiety disorder, unspecified: Secondary | ICD-10-CM | POA: Diagnosis not present

## 2023-04-18 DIAGNOSIS — F419 Anxiety disorder, unspecified: Secondary | ICD-10-CM | POA: Diagnosis not present

## 2023-04-24 DIAGNOSIS — F419 Anxiety disorder, unspecified: Secondary | ICD-10-CM | POA: Diagnosis not present

## 2023-04-25 ENCOUNTER — Encounter: Payer: Medicare Other | Admitting: Psychology

## 2023-05-02 ENCOUNTER — Telehealth: Payer: Self-pay | Admitting: Neurology

## 2023-05-02 NOTE — Telephone Encounter (Signed)
 Patient rescheduled due to would like to see psychiatry before seeing Dr. Teresa Coombs.

## 2023-05-03 DIAGNOSIS — E1165 Type 2 diabetes mellitus with hyperglycemia: Secondary | ICD-10-CM | POA: Diagnosis not present

## 2023-05-03 DIAGNOSIS — F0390 Unspecified dementia without behavioral disturbance: Secondary | ICD-10-CM | POA: Diagnosis not present

## 2023-05-03 DIAGNOSIS — R32 Unspecified urinary incontinence: Secondary | ICD-10-CM | POA: Diagnosis not present

## 2023-05-03 DIAGNOSIS — I10 Essential (primary) hypertension: Secondary | ICD-10-CM | POA: Diagnosis not present

## 2023-05-03 DIAGNOSIS — Z299 Encounter for prophylactic measures, unspecified: Secondary | ICD-10-CM | POA: Diagnosis not present

## 2023-05-08 DIAGNOSIS — F419 Anxiety disorder, unspecified: Secondary | ICD-10-CM | POA: Diagnosis not present

## 2023-05-14 ENCOUNTER — Other Ambulatory Visit: Payer: Self-pay | Admitting: Neurology

## 2023-05-20 DIAGNOSIS — F419 Anxiety disorder, unspecified: Secondary | ICD-10-CM | POA: Diagnosis not present

## 2023-05-22 DIAGNOSIS — F419 Anxiety disorder, unspecified: Secondary | ICD-10-CM | POA: Diagnosis not present

## 2023-06-13 ENCOUNTER — Ambulatory Visit: Payer: Medicare Other | Admitting: Neurology

## 2023-06-17 ENCOUNTER — Encounter: Payer: Medicare Other | Attending: Psychology | Admitting: Psychology

## 2023-06-17 DIAGNOSIS — R413 Other amnesia: Secondary | ICD-10-CM | POA: Diagnosis not present

## 2023-06-17 DIAGNOSIS — F09 Unspecified mental disorder due to known physiological condition: Secondary | ICD-10-CM | POA: Diagnosis not present

## 2023-06-19 DIAGNOSIS — F419 Anxiety disorder, unspecified: Secondary | ICD-10-CM | POA: Diagnosis not present

## 2023-06-26 DIAGNOSIS — F419 Anxiety disorder, unspecified: Secondary | ICD-10-CM | POA: Diagnosis not present

## 2023-07-10 DIAGNOSIS — F419 Anxiety disorder, unspecified: Secondary | ICD-10-CM | POA: Diagnosis not present

## 2023-07-17 DIAGNOSIS — F419 Anxiety disorder, unspecified: Secondary | ICD-10-CM | POA: Diagnosis not present

## 2023-07-24 ENCOUNTER — Encounter: Attending: Psychology

## 2023-07-24 DIAGNOSIS — G478 Other sleep disorders: Secondary | ICD-10-CM | POA: Diagnosis not present

## 2023-07-24 DIAGNOSIS — R262 Difficulty in walking, not elsewhere classified: Secondary | ICD-10-CM | POA: Insufficient documentation

## 2023-07-24 DIAGNOSIS — F32A Depression, unspecified: Secondary | ICD-10-CM | POA: Insufficient documentation

## 2023-07-24 DIAGNOSIS — Z981 Arthrodesis status: Secondary | ICD-10-CM | POA: Diagnosis not present

## 2023-07-24 DIAGNOSIS — E119 Type 2 diabetes mellitus without complications: Secondary | ICD-10-CM | POA: Insufficient documentation

## 2023-07-24 DIAGNOSIS — F09 Unspecified mental disorder due to known physiological condition: Secondary | ICD-10-CM | POA: Insufficient documentation

## 2023-07-24 DIAGNOSIS — Z79899 Other long term (current) drug therapy: Secondary | ICD-10-CM | POA: Insufficient documentation

## 2023-07-24 DIAGNOSIS — R531 Weakness: Secondary | ICD-10-CM | POA: Insufficient documentation

## 2023-07-24 DIAGNOSIS — R413 Other amnesia: Secondary | ICD-10-CM | POA: Diagnosis not present

## 2023-07-24 DIAGNOSIS — R410 Disorientation, unspecified: Secondary | ICD-10-CM | POA: Insufficient documentation

## 2023-07-24 DIAGNOSIS — I10 Essential (primary) hypertension: Secondary | ICD-10-CM | POA: Diagnosis not present

## 2023-07-24 NOTE — Progress Notes (Signed)
 Behavioral Observations:  The patient appeared well-groomed and appropriately dressed. Her manners were polite and appropriate to the situation. She ambulated using a cane and her hearing and vision were adequate for testing. The patient's attitude towards testing was mostly positive and her effort was good, however she did experience some difficulties with frustration tolerance which resulted in the early discontinuation of the WCST.   Neuropsychology Note  Margurite Duffy Martelli completed 155 minutes of neuropsychological testing with technician, Rhett Cella, BA, under the supervision of Chapman Commodore, PsyD., Clinical Neuropsychologist. The patient did not appear overtly distressed by the testing session, per behavioral observation or via self-report to the technician. Rest breaks were offered.   Clinical Decision Making: In considering the patient's current level of functioning, level of presumed impairment, nature of symptoms, emotional and behavioral responses during clinical interview, level of literacy, and observed level of motivation/effort, a battery of tests was selected by Dr. Cheryll Corti during initial consultation on 06/17/2023. This was communicated to the technician. Communication between the neuropsychologist and technician was ongoing throughout the testing session and changes were made as deemed necessary based on patient performance on testing, technician observations and additional pertinent factors such as those listed above.  Tests Administered: Controlled Oral Word Association Test (COWAT; FAS & Animals)  Grooved Pegboard Trail Making Test (TMT; Part A & B) Wechsler Adult Intelligence Scale, 4th Edition (WAIS-IV) Wechsler Memory Scale, 4th Edition (WMS-IV); Older Adult Battery  [place]  Card Sorting Test [facility])  Results:  COWAT:  FAS total= 25 Z=-0.77 Animals total= 11 Z= -1.28  Grooved Pegboard: R (DH)  time= 300s (D/C)  Drops= 8  Percentile Rank=  12th L(NDH) time= 288s  Drops= 4 Percentile Rank= 22nd  TMT:  Trails A time= 96s Errors= 0  Percentile Rank= 21st Trails B time= 228s Errors= 0  Percentile Rank= 30th   WAIS-IV: Composite Score Summary  Scale Sum of Scaled Scores Composite Score Percentile Rank 95% Conf. Interval Qualitative Description  Verbal Comprehension 30 VCI 100 50 94-106 Average  Perceptual Reasoning 21 PRI 82 12 77-89 Low Average  Working Memory 18 WMI 95 37 89-102 Average  Processing Speed 14 PSI 84 14 77-94 Low Average  Full Scale 83 FSIQ 88 21 84-92 Low Average  General Ability 51 GAI 91 27 86-96 Average   Verbal Comprehension Subtests Summary  Subtest Raw Score Scaled Score Percentile Rank Reference Group Scaled Score SEM  Similarities 21 9 37 8 1.12  Vocabulary 42 12 75 12 0.73  Information 12 9 37 9 0.73  The scaled scores in the Reference Group Scaled Score column are based on the performance of examinees aged 20:0-34:11 (i.e., the reference group). See Chapter 6 of the WAIS-IV Technical and Interpretive Manual for more information.  Perceptual Reasoning Subtests Summary  Subtest Raw Score Scaled Score Percentile Rank Reference Group Scaled Score SEM  Block Design 16 6 9 4  1.27  Matrix Reasoning 6 7 16 3  0.73  Visual Puzzles 8 8 25 6  0.99   Working Librarian, academic Raw Score Scaled Score Percentile Rank Reference Group Scaled Score SEM  Digit Span 20 8 25 6  0.79  Arithmetic 12 10 50 9 0.95   Processing Speed Subtests Summary  Subtest Raw Score Scaled Score Percentile Rank Reference Group Scaled Score SEM  Symbol Search 17 8 25 4  1.12  Coding 24 6 9 2  1.12    WMS-IV:  Index Score Summary  Index Sum of Scaled Scores Index Score Percentile Rank 95%  Confidence Interval Tourist information centre manager Memory (AMI) 15 62 1 58-70 Extremely Low  Visual Memory (VMI) 5 54 0.1 50-60 Extremely Low  Immediate Memory (IMI) 12 63 1 59-71 Extremely Low  Delayed Memory (DMI)  8 54 0.1 50-65 Extremely Low    Primary Subtest Scaled Score Summary  Subtest Domain Raw Score Scaled Score Percentile Rank  Logical Memory I AM 16 5 5   Logical Memory II AM 0 1 0.1  Verbal Paired Associates I AM 6 4 2   Verbal Paired Associates II AM 2 5 5   Visual Reproduction I VM 13 3 1   Visual Reproduction II VM 0 2 0.4  Symbol Span VWM 6 5 5    Auditory Memory Process Score Summary  Process Score Raw Score Scaled Score Percentile Rank Cumulative Percentage (Base Rate)  LM II Recognition 12 - - 3-9%  VPA II Recognition 17 - - 3-9%   Visual Memory Process Score Summary  Process Score Raw Score Scaled Score Percentile Rank Cumulative Percentage (Base Rate)  VR II Recognition 1 - - 3-9%    ABILITY-MEMORY ANALYSIS  Ability Score:  VCI: 100 Date of Testing:  WAIS-IV; WMS-IV 2023/07/24  Predicted Difference Method   Index Predicted WMS-IV Index Score Actual WMS-IV Index Score Difference Critical Value  Significant Difference Y/N Base Rate  Auditory Memory 100 62 38 9.39 Y <1%  Visual Memory 100 54 46 8.28 Y <1%  Immediate Memory 100 63 37 10.49 Y <1%  Delayed Memory 100 54 46 12.08 Y <1%  Statistical significance (critical value) at the .01 level.   WCST:  Administered but not completed due to difficulties with frustration tolerance  (Incomplete) test results:   Trials Administered: 17 Total Correct: 5  Total Errors: 12 %Errors: 71%  Perseverative Responses: 1 %Pers Responses: 6%  Perseverative Errors: 1 %Pers Errors: 6%  Nonperseverative Errors: 11 %Nonpersev Errors: 65%  Conceptual Lvl Responses: 4 %Conceptual Lvl Responses: 24%   Categories Completed: 0 Trials to 1st: 129 Failure to Maintain Set: 0    Feedback to Patient: Mila Pair Katen will return on 12/18/2023 for an interactive feedback session with Dr. Cheryll Corti at which time her test performances, clinical impressions and treatment recommendations will be reviewed in detail. The  patient understands she can contact our office should she require our assistance before this time.  155 minutes spent face-to-face with patient administering standardized tests, 30 minutes spent scoring Radiographer, therapeutic). [CPT [id number], [id number]]  Full report to follow.

## 2023-07-31 DIAGNOSIS — F419 Anxiety disorder, unspecified: Secondary | ICD-10-CM | POA: Diagnosis not present

## 2023-08-07 DIAGNOSIS — F419 Anxiety disorder, unspecified: Secondary | ICD-10-CM | POA: Diagnosis not present

## 2023-08-09 DIAGNOSIS — E1165 Type 2 diabetes mellitus with hyperglycemia: Secondary | ICD-10-CM | POA: Diagnosis not present

## 2023-08-09 DIAGNOSIS — I10 Essential (primary) hypertension: Secondary | ICD-10-CM | POA: Diagnosis not present

## 2023-08-09 DIAGNOSIS — R52 Pain, unspecified: Secondary | ICD-10-CM | POA: Diagnosis not present

## 2023-08-09 DIAGNOSIS — M545 Low back pain, unspecified: Secondary | ICD-10-CM | POA: Diagnosis not present

## 2023-08-09 DIAGNOSIS — Z299 Encounter for prophylactic measures, unspecified: Secondary | ICD-10-CM | POA: Diagnosis not present

## 2023-08-09 DIAGNOSIS — R32 Unspecified urinary incontinence: Secondary | ICD-10-CM | POA: Diagnosis not present

## 2023-08-14 DIAGNOSIS — F419 Anxiety disorder, unspecified: Secondary | ICD-10-CM | POA: Diagnosis not present

## 2023-08-21 DIAGNOSIS — F419 Anxiety disorder, unspecified: Secondary | ICD-10-CM | POA: Diagnosis not present

## 2023-08-24 ENCOUNTER — Other Ambulatory Visit: Payer: Self-pay | Admitting: Neurology

## 2023-08-26 ENCOUNTER — Other Ambulatory Visit: Payer: Self-pay

## 2023-08-26 ENCOUNTER — Telehealth: Payer: Self-pay | Admitting: Neurology

## 2023-08-26 NOTE — Telephone Encounter (Signed)
 Pt called to confirm appointment details and to confirm that her medication refill request is being processed

## 2023-08-26 NOTE — Progress Notes (Signed)
 Patient called in, requesting refill of aricept . Refill sent to pharmacy

## 2023-08-28 DIAGNOSIS — F419 Anxiety disorder, unspecified: Secondary | ICD-10-CM | POA: Diagnosis not present

## 2023-09-11 DIAGNOSIS — F419 Anxiety disorder, unspecified: Secondary | ICD-10-CM | POA: Diagnosis not present

## 2023-09-25 ENCOUNTER — Encounter: Payer: Self-pay | Admitting: Neurology

## 2023-09-25 ENCOUNTER — Ambulatory Visit (INDEPENDENT_AMBULATORY_CARE_PROVIDER_SITE_OTHER): Payer: Medicare Other | Admitting: Neurology

## 2023-09-25 ENCOUNTER — Other Ambulatory Visit: Payer: Self-pay | Admitting: Neurology

## 2023-09-25 VITALS — BP 143/78 | HR 85 | Resp 14

## 2023-09-25 DIAGNOSIS — G3184 Mild cognitive impairment, so stated: Secondary | ICD-10-CM

## 2023-09-25 DIAGNOSIS — G309 Alzheimer's disease, unspecified: Secondary | ICD-10-CM | POA: Diagnosis not present

## 2023-09-25 MED ORDER — DONEPEZIL HCL 10 MG PO TABS: 10.0000 mg | ORAL_TABLET | Freq: Every day | ORAL | 3 refills | Status: AC

## 2023-09-25 NOTE — Progress Notes (Signed)
 [facility]  PATIENT: Michelle Case DOB: [birth year]  REQUESTING CLINICIAN: Rosamond Leta NOVAK, MD HISTORY FROM: Patient, friend Michelle Case  REASON FOR VISIT: Memory loss    HISTORICAL  CHIEF COMPLAINT:  Chief Complaint  Patient presents with   Memory Loss    Rm12, friend and caretaker and support corgi pup present, Memory: moca score 20/30    INTERVAL HISTORY 09/25/2023 Patient presents today for follow-up, she is accompanied by her friend.  Last visit was a year ago, at that time we obtain ATN profile which showed presence of Alzheimer disease biomarkers, MRI brain was also completed with no acute abnormality.  I have called patient and informed her that her cognitive impairment was due to Alzheimer disease and we started her on Aricept  5 mg nightly.  She is compliant with the medication, denies any side effects.  Overall she tells me she is stable, still lives at home, independent in all of her ADLs.  Her daughter has recommended that she come and live with her but as of now she is still home by herself.  Denies any recent fall.   HISTORY OF PRESENT ILLNESS:  This is a 73 year old woman past medical history of hypertension, diabetes mellitus, depression who is presenting with memory loss.  Patient reports memory loss has been going on for the past 3 years since COVID.  She reports during COVID she lost her sister, she lost other relative and since then she noted that her memory has been deteriorating.  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  She is accompanied by her friend Michelle Case who is also her helper.  Michelle Case reports that patient tends to repeat herself, she is forgetful, she needs reminders.  She does help her with appointments.  And also remind her regarding her bills.  She does not drive. She reports that her life changed since her husband died in Oct 12, 2013she was able to function but  since COVID it got worse.   TBI:    No past history of TBI Stroke:   no past history of stroke Seizures:   no past history of seizures Sleep:  no history of sleep apnea.  Mood:  Depression, never took medicine  Family history of Dementia:  Denies  Functional status: independent in most ADLs and IADLs Patient lives alone. Cooking: yes, but do a lot microwave food  Cleaning: patient but limited by pain  Shopping: with helper  Bathing: patient  Toileting: patient  Driving: Not since husband died  Bills: Patient   Ever left the stove on by accident?: denies  Forget how to use items around the house?: denies  Getting lost going to familiar places?: denies  Forgetting loved ones names?: Denies  Word finding difficulty? Yes  Sleep: Disrupted sleep, wakes up multiple time per night    OTHER MEDICAL CONDITIONS: Diabetes, Depression, Hypertension   ALLERGIES: No Known Allergies  HOME MEDICATIONS: Outpatient Medications Prior to Visit  Medication Sig Dispense Refill   aspirin  81 MG chewable tablet Chew 81 mg by mouth daily.     baclofen (LIORESAL) 10 MG tablet Take 10 mg by mouth 3 (three) times daily.     Coenzyme Q10 100 MG TABS Take 500 mg by mouth daily.     diclofenac (VOLTAREN) 75 MG EC tablet Take 1 tablet (75 mg total) by mouth 2 (two) times daily.     diclofenac sodium (  VOLTAREN) 1 % GEL Apply 2 g topically daily as needed (muscle soreness).     donepezil  (ARICEPT ) 5 MG tablet take 1 tablet (5 milligram total) by mouth at bedtime. 90 tablet 0   glyBURIDE -metformin  (GLUCOVANCE ) 5-500 MG per tablet Take 1 tablet by mouth daily with breakfast.      methocarbamol (ROBAXIN) 500 MG tablet Take 500 mg by mouth 2 (two) times daily.     mirabegron ER (MYRBETRIQ) 25 MG TB24 tablet Take 25 mg by mouth daily.     ramipril  (ALTACE ) 5 MG capsule Take 5 mg by mouth daily.     rosuvastatin (CRESTOR) 10 MG tablet Take 10 mg by mouth daily.     No facility-administered medications prior  to visit.    PAST MEDICAL HISTORY: Past Medical History:  Diagnosis Date   Cervical pain (neck)    Degenerative disk disease    Degenerative joint disease    Diabetes mellitus    GERD (gastroesophageal reflux disease)    Hyperlipidemia    managing with diet   Hypertension    Mitral valve prolapse    states hx of   Neuropathy    Thyroid  cyst     PAST SURGICAL HISTORY: Past Surgical History:  Procedure Laterality Date   ABDOMINAL HYSTERECTOMY     ANTERIOR CERVICAL CORPECTOMY  01/08/2012   Procedure: ANTERIOR CERVICAL CORPECTOMY;  Surgeon: Victory Gens, MD;  Location: [facility];  Service: Neurosurgery;  Laterality: N/A;  Cervical five-six Corpectomy, Cervical four-seven Arthrodesis   COLONOSCOPY N/A 11/06/2017   Procedure: COLONOSCOPY;  Surgeon: Golda Claudis PENNER, MD;  Location: [facility];  Service: Endoscopy;  Laterality: N/A;  100   colonscopy     polyp- cancer in situ   HAND SURGERY     right hand   HAND SURGERY     right   LAPAROSCOPIC TOTAL HYSTERECTOMY  1990   POLYPECTOMY  11/06/2017   Procedure: POLYPECTOMY;  Surgeon: Golda Claudis PENNER, MD;  Location: [facility];  Service: Endoscopy;;  colon    FAMILY HISTORY: Family History  Problem Relation Age of Onset   Stroke Mother    Stroke Father    Stroke Sister    Stroke Brother    Heart disease Brother     SOCIAL HISTORY: Social History   Socioeconomic History   Marital status: Widowed    Spouse name: Not on file   Number of children: Not on file   Years of education: Not on file   Highest education level: Not on file  Occupational History   Not on file  Tobacco Use   Smoking status: Former   Smokeless tobacco: Never   Tobacco comments:    stopped 1960's  Substance and Sexual Activity   Alcohol  use: No   Drug use: No   Sexual activity: Yes  Other Topics Concern   Not on file  Social History Narrative   Not on file   Social Drivers of Health   Financial Resource Strain: Not on file   Food Insecurity: Not on file  Transportation Needs: Not on file  Physical Activity: Not on file  Stress: Not on file  Social Connections: Not on file  Intimate Partner Violence: Not on file    PHYSICAL EXAM  GENERAL EXAM/CONSTITUTIONAL: Vitals:  Vitals:   09/25/23 1353 09/25/23 1409  BP: (!) 166/84 (!) 143/78  Pulse: 85   Resp: 14   SpO2: 97%     There is no height or weight on file  to calculate BMI. Wt Readings from Last 3 Encounters:  06/13/22 135 lb 8 oz (61.5 kg)  11/06/17 145 lb (65.8 kg)  01/07/12 155 lb (70.3 kg)   Patient is in no distress; well developed, nourished and groomed; neck is supple  MUSCULOSKELETAL: Gait, strength, tone, movements noted in Neurologic exam below  NEUROLOGIC: MENTAL STATUS:      No data to display            09/25/2023    2:05 PM 06/13/2022   10:53 AM  Montreal Cognitive Assessment   Visuospatial/ Executive (0/5) 3 2  Naming (0/3) 2 3  Attention: Read list of digits (0/2) 2 2  Attention: Read list of letters (0/1) 1 0  Attention: Serial 7 subtraction starting at 100 (0/3) 2 1  Language: Repeat phrase (0/2) 1 1  Language : Fluency (0/1) 1 0  Abstraction (0/2) 2 2  Delayed Recall (0/5) 0 3  Orientation (0/6) 6 6  Total 20 20  Adjusted Score (based on education)  20     CRANIAL NERVE:  2nd, 3rd, 4th, 6th- visual fields full to confrontation, extraocular muscles intact, no nystagmus 5th - facial sensation symmetric 7th - facial strength symmetric 8th - hearing intact 9th - palate elevates symmetrically, uvula midline 11th - shoulder shrug symmetric 12th - tongue protrusion midline  MOTOR:  normal bulk and tone, full strength in the BUE, BLE  SENSORY:  normal and symmetric to light touch  COORDINATION:  finger-nose-finger, fine finger movements normal  GAIT/STATION:  Walks with a cane      DIAGNOSTIC DATA (LABS, IMAGING, TESTING) - I reviewed patient records, labs, notes, testing and imaging myself where  available.  Lab Results  Component Value Date   WBC 8.5 01/08/2012   HGB 13.1 01/08/2012   HCT 39.5 01/08/2012   MCV 82.6 01/08/2012   PLT 269 01/08/2012      Component Value Date/Time   NA 138 01/08/2012 0748   K 4.1 01/08/2012 0748   CL 100 01/08/2012 0748   CO2 26 01/08/2012 0748   GLUCOSE 119 (H) 01/08/2012 0748   BUN 12 01/08/2012 0748   CREATININE 0.60 01/08/2012 0748   CALCIUM 9.9 01/08/2012 0748   GFRNONAA >90 01/08/2012 0748   GFRAA >90 01/08/2012 0748   No results found for: CHOL, HDL, LDLCALC, LDLDIRECT, TRIG, CHOLHDL No results found for: YHAJ8R Lab Results  Component Value Date   VITAMINB12 >2000 (H) 06/13/2022   Lab Results  Component Value Date   TSH 1.170 06/13/2022      ASSESSMENT AND PLAN  73 y.o. year old female with history of hypertension, depression, diabetes who is presenting for follow-up for her mild cognitive impairment due to Alzheimer's disease.  She is overall stable, still lives at home, independent in her ADL.  She is already on Aricept  5 mg nightly, will increase to 10 mg nightly, side effect of medication discussed with patient and she understands to contact us  if she experiences any diarrhea, dizziness or vivid dream.  Advised her to continue following up with her PCP and to call us  if her symptoms do get worse.  Return as needed.   1. Mild cognitive impairment (MCI) due to Alzheimer's disease [facility])     Patient Instructions  Increase Aricept  10 mg nightly, side effect include diarrhea, dizziness and vivid dreams Continue your other medications Please contact us  if your symptoms do get worse, if you have any worsening of the behavior. Return as needed.   There  are well-accepted and sensible ways to reduce risk for Alzheimers disease and other degenerative brain disorders .  Exercise Daily Walk A daily 20 minute walk should be part of your routine. Disease related apathy can be a significant roadblock to exercise and  the only way to overcome this is to make it a daily routine and perhaps have a reward at the end (something your loved one loves to eat or drink perhaps) or a personal trainer coming to the home can also be very useful. Most importantly, the patient is much more likely to exercise if the caregiver / spouse does it with him/her. In general a structured, repetitive schedule is best.  General Health: Any diseases which effect your body will effect your brain such as a pneumonia, urinary infection, blood clot, heart attack or stroke. Keep contact with your primary care doctor for regular follow ups.  Sleep. A good nights sleep is healthy for the brain. Seven hours is recommended. If you have insomnia or poor sleep habits we can give you some instructions. If you have sleep apnea wear your mask.  Diet: Eating a heart healthy diet is also a good idea; fish and poultry instead of red meat, nuts (mostly non-peanuts), vegetables, fruits, olive oil or canola oil (instead of butter), minimal salt (use other spices to flavor foods), whole grain rice, bread, cereal and pasta and wine in moderation.Research is now showing that the MIND diet, which is a combination of The Mediterranean diet and the DASH diet, is beneficial for cognitive processing and longevity. Information about this diet can be found in The MIND Diet, a book by Annitta Feeling, MS, RDN, and online at [employer]  Finances, Power of [address] and Advance Directives: You should consider putting legal safeguards in place with regard to financial and medical decision making. While the spouse always has power of attorney for medical and financial issues in the absence of any form, you should consider what you want in case the spouse / caregiver is no longer around or capable of making decisions.    No orders of the defined types were placed in this encounter.   No orders of the defined types were placed in this  encounter.   No follow-ups on file.   Pastor Falling, MD 09/25/2023, 2:58 PM  [facility] [address], [address] [place], [place] [address] [phone]

## 2023-09-25 NOTE — Patient Instructions (Signed)
 Increase Aricept  10 mg nightly, side effect include diarrhea, dizziness and vivid dreams Continue your other medications Please contact us  if your symptoms do get worse, if you have any worsening of the behavior. Return as needed.   There are well-accepted and sensible ways to reduce risk for Alzheimers disease and other degenerative brain disorders .  Exercise Daily Walk A daily 20 minute walk should be part of your routine. Disease related apathy can be a significant roadblock to exercise and the only way to overcome this is to make it a daily routine and perhaps have a reward at the end (something your loved one loves to eat or drink perhaps) or a personal trainer coming to the home can also be very useful. Most importantly, the patient is much more likely to exercise if the caregiver / spouse does it with him/her. In general a structured, repetitive schedule is best.  General Health: Any diseases which effect your body will effect your brain such as a pneumonia, urinary infection, blood clot, heart attack or stroke. Keep contact with your primary care doctor for regular follow ups.  Sleep. A good nights sleep is healthy for the brain. Seven hours is recommended. If you have insomnia or poor sleep habits we can give you some instructions. If you have sleep apnea wear your mask.  Diet: Eating a heart healthy diet is also a good idea; fish and poultry instead of red meat, nuts (mostly non-peanuts), vegetables, fruits, olive oil or canola oil (instead of butter), minimal salt (use other spices to flavor foods), whole grain rice, bread, cereal and pasta and wine in moderation.Research is now showing that the MIND diet, which is a combination of The Mediterranean diet and the DASH diet, is beneficial for cognitive processing and longevity. Information about this diet can be found in The MIND Diet, a book by Annitta Feeling, MS, RDN, and online at [employer]  Finances,  Power of [address] and Advance Directives: You should consider putting legal safeguards in place with regard to financial and medical decision making. While the spouse always has power of attorney for medical and financial issues in the absence of any form, you should consider what you want in case the spouse / caregiver is no longer around or capable of making decisions.

## 2023-10-02 DIAGNOSIS — F419 Anxiety disorder, unspecified: Secondary | ICD-10-CM | POA: Diagnosis not present

## 2023-10-09 DIAGNOSIS — F419 Anxiety disorder, unspecified: Secondary | ICD-10-CM | POA: Diagnosis not present

## 2023-10-15 ENCOUNTER — Encounter: Payer: Self-pay | Admitting: Psychology

## 2023-10-15 NOTE — Progress Notes (Signed)
 Neuropsychological Consultation   Patient:   Michelle Case   DOB:   [birth year]  MR Number:  [id number]  Location:  [facility] FOR PAIN AND REHABILITATIVE MEDICINE [facility] PHYSICAL MEDICINE AND REHABILITATION [address] [place], [address] [place] [address] Dept: [phone]           Date of Service:   06/17/2023  Location of Service and Individuals present: Today's visit was conducted in my outpatient office with the patient, her close friend and myself present for this visit.  Start Time:   3 PM End Time:   5 PM  Today's visit was 1 hour and 15 minutes spent in face-to-face clinical interview and the other 45 minutes spent with record review, report writing and setting up testing protocols.  Patient Consent and Confidentiality: Limits of confidentiality reviewed with noted emphasis on the fact that the patient has been referred for neuropsychological evaluation and expected formal neuropsychological report to be produced and made available to her referring neurologist as well as being made available in the patient's electronic medical records for other medical professionals to have access to.  Patient consents to proceed.  Consent for Evaluation and Treatment:  Signed:  Yes Explanation of Privacy Policies:  Signed:  Yes Discussion of Confidentiality Limits:  Yes  Provider/Observer:  Norleen Asa, Psy.D.       Clinical Neuropsychologist       Billing Code/Service: 96116/96121  Chief Complaint:     Chief Complaint  Patient presents with   Memory Loss   Other    Cognitive change    Reason for Service:    Michelle Case is a 73 year old female is referred for neuropsychological consultation by her treating neurologist, Pastor Falling, MD, due to memory loss and mild cognitive impairments possibly related to Alzheimer's type pathology. An initial neurology visit with an accompanying ATN profile suggested Alzheimer's pathology without signs of  vascular-related pathology. She was started on Aricept  nightly, which was tolerated without side effects and with good compliance. Over the next year, she was noted to be fairly stable and independent with all ADLs. She continues to live alone, though her daughter has recommended she move in with her. Past medical history includes hypertension, diabetes, depression, and memory loss reported over the last three years. She notes the onset of cognitive changes around the COVID pandemic, during which time she also experienced the death of her sister and other relatives. 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 She is no longer driving. Her friend notes she repeats herself, is more forgetful, and requires reminders for appointments and managing bills. Symptoms are reported to have been present for approximately four years, with increasing forgetfulness and episodes of confusion. She has noted difficulty with sequencing and remembering recent events, such as meals. She has increasing weakness and difficulty with walking. A history of a TIA/mini-stroke was reported, but not found in medical records. She also has a history of cervical spine fusion. Sleep is disorganized and she wakes frequently during the night. Appetite is reduced.  Medical History: Cervical pain with previous cervical fusion, degenerative joint disease, degenerative disc disease, long-standing diabetes, hyperlipidemia, hypertension, mitral valve prolapse, and neuropathy in the feet. Current medications include donepezil  (Aricept ), robaxin, and Voltaren gel.  Medical History:   Past Medical History:  Diagnosis Date   Cervical pain (neck)    Degenerative disk disease    Degenerative joint disease    Diabetes  mellitus    GERD (gastroesophageal reflux disease)    Hyperlipidemia    managing with diet   Hypertension    Mitral valve prolapse    states hx of   Neuropathy     Thyroid  cyst         Patient Active Problem List   Diagnosis Date Noted   History of colonic polyps 08/23/2017    Onset and Duration of Symptoms:  The onset of cognitive changes was noted around the COVID pandemic, approximately 2020/2021. Memory loss has been reported for the past three years. She was able to manage well after her husband's passing in 2013 until the COVID timeframe. She had been doing tax preparation work into 2020 and began noticing it took longer to complete tasks and had more difficulty with the work.  Progression of Symptoms:  Symptoms of forgetfulness and confusion have been increasing over the past four years. She has had increasing difficulty managing day-to-day affairs. Her friend reports no significant change in executive functioning and no expressive or receptive language difficulties.  Additional Tests and Measures from other records:  Neuroimaging Results:  An MRI was conducted on 07/13/2023 and interpreted by Dr. Julianna. It was read as an unremarkable MRI with no acute findings and no noted residual effects from a past TIA.   Laboratory Tests:  ATN profile blood work indicated a low beta-amyloid 42/40 finding, high P-tau 181 concentrations, and normal NFL concentrations, consistent with patterns associated with Alzheimer's related pathology. Other blood work has been within normal limits, with the exception of mild to moderate elevations in blood glucose levels, noted in the EMR since at least 2013. Her current normal blood glucose is around 127.   Sleep:  Sleep is described as disorganized. She reports turning off the TV around midnight but often wakes during the night and turns the TV back on. She tends to wake early with difficulty returning to sleep. Denies snoring or other symptoms of obstructive sleep apnea.  Diet Pattern:  Reports a reduction in appetite and eats less than previously. She is managing her blood glucose levels but has difficulty keeping up  with all instructions.  Behavioral Observation/Mental Status: The patient is a 73 year old female, accompanied by her friend, Michelle Case.  The patient was cooperative and motivated during the visit. She reports episodic and semantic memory difficulties, most commonly forgetting where she has placed items and needing to backtrack to find them. She notes taking more time to remember meals and has more difficulty with sequencing tasks. She and her friend deny any expressive or receptive language difficulties. No suicidal or homicidal ideation reported.   Michelle Case  presents as a 73 y.o.-year-old Right handed Female who appeared her stated age. her dress was Appropriate and she was Well Groomed and her manners were Appropriate to the situation.  her participation was indicative of Appropriate and Redirectable behaviors.  There were not physical disabilities noted.  she displayed an appropriate level of cooperation and motivation.    Interactions:    Active Appropriate  Attention:   abnormal and attention span appeared shorter than expected for age  Memory:   abnormal; remote memory intact, recent memory impaired  Visuo-spatial:   not examined  Speech (Volume):  normal  Speech:   normal; normal  Thought Process:  Coherent and Relevant  Coherent, Linear, and Logical  Though Content:  WNL; not suicidal and not homicidal  Orientation:   person, place, time/date, and situation  Judgment:   Fair  Planning:  Fair  Affect:    Appropriate  Mood:    Dysphoric  Insight:   Good  Intelligence:   high  Marital Status/Living:  Born in [place] . She has three siblings, one of whom is deceased. No significant childhood illnesses. Grew up in [place], [place] , moved to [place] after high school, and then to [place] , [place] . She currently lives alone and has since her second husband passed away in 11/02/11 after more than 30 years of marriage. Her first marriage lasted 10  years. She has one 29 year old child who works as an Art gallery manager.   Educational and Occupational History:     Highest Level of Education:  Graduated from high school. Attended [employer], earning an associate's degree, a Barista, and a Set designer Work (MSW).  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  Psychiatric History:  No prior significant psychiatric history  History of Substance Use or Abuse:  No concerns of substance abuse are reported.    Family Med/Psych History:  Family History  Problem Relation Age of Onset   Stroke Mother    Stroke Father    Stroke Sister    Stroke Brother    Heart disease Brother     Impression/DX:   Michelle Case is a 73 year old female referred by her neurologist, Dr. Pastor Falling, for neuropsychological consultation for memory loss and mild cognitive impairment. Symptoms began around the time of the COVID pandemic and have progressed over the last three to four years, characterized by increased forgetfulness, word-finding difficulty, and challenges with daily tasks, requiring reminders for appointments and bills. She has discontinued driving. Her past medical history is significant for hypertension, diabetes, and depression. An ATN profile was suggestive of Alzheimer's pathology. An MRI was unremarkable. She is independent with ADLs but is experiencing increasing functional difficulties.   Disposition/Plan:  The patient will be scheduled for formal neuropsychological testing. The assessment will include a foundational battery consisting of the Wechsler Adult Intelligence Scale, Wechsler Memory Scale,  Controlled Oral Word Association Test, Grooved Pegboard Test, and Trail Making Test. Upon completion, a formal neuropsychological report will be generated and provided to the referring physician. A feedback session will be scheduled to review results and discuss recommendations.   Diagnosis:    Memory loss  Cognitive and neurobehavioral dysfunction        Note: This document was prepared using Dragon voice recognition software and may include unintentional dictation errors.   Electronically Signed   _______________________ Norleen Asa, Psy.D. Clinical Neuropsychologist

## 2023-10-16 DIAGNOSIS — F419 Anxiety disorder, unspecified: Secondary | ICD-10-CM | POA: Diagnosis not present

## 2023-10-23 DIAGNOSIS — F419 Anxiety disorder, unspecified: Secondary | ICD-10-CM | POA: Diagnosis not present

## 2023-11-05 DIAGNOSIS — E7800 Pure hypercholesterolemia, unspecified: Secondary | ICD-10-CM | POA: Diagnosis not present

## 2023-11-05 DIAGNOSIS — R5383 Other fatigue: Secondary | ICD-10-CM | POA: Diagnosis not present

## 2023-11-05 DIAGNOSIS — E1165 Type 2 diabetes mellitus with hyperglycemia: Secondary | ICD-10-CM | POA: Diagnosis not present

## 2023-11-05 DIAGNOSIS — Z0000 Encounter for general adult medical examination without abnormal findings: Secondary | ICD-10-CM | POA: Diagnosis not present

## 2023-11-05 DIAGNOSIS — Z1331 Encounter for screening for depression: Secondary | ICD-10-CM | POA: Diagnosis not present

## 2023-11-05 DIAGNOSIS — I10 Essential (primary) hypertension: Secondary | ICD-10-CM | POA: Diagnosis not present

## 2023-11-05 DIAGNOSIS — Z1339 Encounter for screening examination for other mental health and behavioral disorders: Secondary | ICD-10-CM | POA: Diagnosis not present

## 2023-11-05 DIAGNOSIS — Z79899 Other long term (current) drug therapy: Secondary | ICD-10-CM | POA: Diagnosis not present

## 2023-11-05 DIAGNOSIS — Z7189 Other specified counseling: Secondary | ICD-10-CM | POA: Diagnosis not present

## 2023-11-05 DIAGNOSIS — Z299 Encounter for prophylactic measures, unspecified: Secondary | ICD-10-CM | POA: Diagnosis not present

## 2023-11-13 DIAGNOSIS — F419 Anxiety disorder, unspecified: Secondary | ICD-10-CM | POA: Diagnosis not present

## 2023-11-25 DIAGNOSIS — Z2821 Immunization not carried out because of patient refusal: Secondary | ICD-10-CM | POA: Diagnosis not present

## 2023-11-25 DIAGNOSIS — E119 Type 2 diabetes mellitus without complications: Secondary | ICD-10-CM | POA: Diagnosis not present

## 2023-11-25 DIAGNOSIS — I10 Essential (primary) hypertension: Secondary | ICD-10-CM | POA: Diagnosis not present

## 2023-11-25 DIAGNOSIS — R52 Pain, unspecified: Secondary | ICD-10-CM | POA: Diagnosis not present

## 2023-11-25 DIAGNOSIS — Z299 Encounter for prophylactic measures, unspecified: Secondary | ICD-10-CM | POA: Diagnosis not present

## 2023-11-27 DIAGNOSIS — F419 Anxiety disorder, unspecified: Secondary | ICD-10-CM | POA: Diagnosis not present

## 2023-12-05 ENCOUNTER — Encounter: Attending: Psychology | Admitting: Psychology

## 2023-12-05 ENCOUNTER — Encounter: Payer: Self-pay | Admitting: Psychology

## 2023-12-05 DIAGNOSIS — G309 Alzheimer's disease, unspecified: Secondary | ICD-10-CM | POA: Diagnosis not present

## 2023-12-05 DIAGNOSIS — R413 Other amnesia: Secondary | ICD-10-CM | POA: Insufficient documentation

## 2023-12-05 DIAGNOSIS — F0280 Dementia in other diseases classified elsewhere without behavioral disturbance: Secondary | ICD-10-CM | POA: Insufficient documentation

## 2023-12-05 NOTE — Progress Notes (Signed)
 Neuropsychological Evaluation   Patient:  Michelle Case   DOB: [birth year]  MR Number: [id number]  Location: [facility] [place] [facility] [address] [place], [address] [place] [address] Dept: [phone]  Start: 9 AM End: 10 AM  Provider/Observer:     Norleen JONELLE Asa PsyD  Chief Complaint:      Chief Complaint  Patient presents with   Memory Loss   Other    Cognitive changes    Reason For Service:      Avelina LABOR. [place] is a 73 year old female referred by neurologist Pastor Falling, MD for evaluation of memory loss and mild cognitive impairment, likely related to Alzheimer's pathology. An ATN profile supported this diagnosis, and she was started on Aricept , which she tolerates well.  History & Symptom Progression:  Cognitive changes began around the COVID pandemic (2020/2021), coinciding with significant personal losses.  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  Reports difficulty with sequencing, recalling recent events, and increased physical weakness.  A TIA was suspected but not confirmed in records.  Sleep is fragmented, and appetite has declined. Cervical pain with previous fusion and degenerative joint disease/DDD etc. Functional Status:  Lives alone but receives support from her daughter and a friend.  Independent with ADLs but needs reminders for appointments and bills.  No significant language or executive function deficits reported. Medical History: Includes hypertension, diabetes, depression, cervical spine fusion, degenerative joint/disc disease, neuropathy, mitral valve prolapse, and hyperlipidemia. Medications: Currently taking donepezil  (Aricept ), Robaxin, and Voltaren gel. Testing & Imaging:  MRI (07/13/2023): Unremarkable, no acute findings or TIA  evidence.  ATN profile: Low beta-amyloid 42/40, high P-tau 181, normal NFL -- consistent with Alzheimer's pathology.  Blood glucose mildly elevated (around 127), consistent with long-standing diabetes. Sleep & Diet:  Disorganized sleep with frequent nighttime awakenings.  Reduced appetite and difficulty adhering to dietary instructions.  The complete background clinical knowledge of this information above was summarized from can be found in the patient's EMR dated 06/17/2023.   Medical History:                         Past Medical History:  Diagnosis Date   Cervical pain (neck)     Degenerative disk disease     Degenerative joint disease     Diabetes mellitus     GERD (gastroesophageal reflux disease)     Hyperlipidemia      managing with diet   Hypertension     Mitral valve prolapse      states hx of   Neuropathy     Thyroid  cyst                                                               Patient Active Problem List    Diagnosis Date Noted   History of colonic polyps 08/23/2017     Tests Administered: Controlled Oral Word Association Test (COWAT; FAS & Animals)  Grooved Pegboard Trail Making Test (TMT; Part A & B) Wechsler Adult Intelligence Scale, 4th Edition (WAIS-IV) Wechsler Memory Scale, 4th Edition (WMS-IV); Older Adult Battery  [place]  Card Sorting  Test [facility])  Participation Level:   Active  Participation Quality:  Appropriate      Behavioral Observation:  The patient appeared well-groomed and appropriately dressed. Her manners were polite and appropriate to the situation. She ambulated using a cane and her hearing and vision were adequate for testing. The patient's attitude towards testing was mostly positive and her effort was good, however she did experience some difficulties with frustration tolerance which resulted in the early discontinuation of the WCST.   Well Groomed, Alert, and Appropriate.   Test Results:   Initially, an estimation was made as  to the patient's premorbid intellectual and cognitive functioning to provide a comparison point for current obtained objective neuropsychological test data.  The patient, after completing high school, attended the [employer] earning and initially an associates degree then a bachelor's of science and ultimately completed a masters of social work degree and is clearly always done very well in school.  With his history, I would expect a minimum of high average level of intellectual and cognitive functioning with some areas of cognitive functioning particularly reaching higher.  We will utilize a high average or 1 standard deviation above normative expectations on most cognitive functioning domains as a comparison point when looking at current objective cognitive functioning measures.  Secondly, an estimation was made as to the patient's effort and validity of the current assessment protocols.  The patient appeared polite and engaged throughout the testing and maintained a positive attitude and good effort.  There were noted some frustration tolerance on one of the measures ([place]  card sorting test) and this was discontinued early but patterns looking at roll test data suggest difficulties with cognitive flexibility creating the frustration identified during test administration.  This does appear to be a fair and valid assessment of the patient's current cognitive functioning.  COWAT:  FAS total= 25 Z=-0.77 Animals total= 11 Z= -1.28  As the patient has described issues with verbal fluency and word finding issues, the patient was administered to control oral Word Association test to objectively assess both lexical and semantic fluency.  On the FAS test, which assesses lexical fluency, the patient reduced a total of 25 words during the allotted times.  This is on the very lower end of the average range when compared to age, gender and education matched control group.  This does suggest some  difficulties as the patient likely has had skill in these areas given her occupational history.  2 object semantic or targeted fluency the patient was administered the animal naming test as well.  The patient had more difficulty with targeted naming where she performed in the mildly impaired range relative to normative comparisons.  Semantic fluency appears to be more impacted than lexical fluency but both of these areas are below expected premorbid functioning.  Grooved Pegboard: R (DH)  time= 300s (D/C)  Drops= 8  Percentile Rank= 12th L(NDH) time= 288s  Drops= 4 Percentile Rank= 22nd   To assess for fine motor control status bilaterally and potentially assess for any patterns of lateralization of findings the patient was administered the grooved pegboard test.  The patient had significant difficulties for both her right dominant hand and left nondominant hand particularly for dropped keys.  While the time required to complete the task for her right dominant hand expire before completion and she had a dropped keys she also had some significant difficulties for her left dominant hand with 4 dropped keys.  She did complete the pattern with her left  nondominant hand but had significant difficulties.  While there was greater difficulty noticed for her right dominant hand and clear issues with fine motor control this is not particularly clear as far as strong lateralization of findings.  TMT:  Trails A time= 96s Errors= 0  Percentile Rank= 21st Trails B time= 228s Errors= 0  Percentile Rank= 30th   The patient was also administered the Trail Making Test part A's and B.  On the Trail making test part a which assesses primary focus execute abilities without cognitive shifting the patient performed at the 21st percentile which is below expected levels of performance when compared to premorbid estimates.  The patient had a similar performance on the trails B subtest ranking at the 30th percentile.  This  does not suggest significant difficulties with cognitive flexibility but performance is below expected levels of premorbid estimates.   WAIS-IV:           Composite Score Summary          Scale Sum of Scaled Scores Composite Score Percentile Rank 95% Conf. Interval Qualitative Description  Verbal Comprehension 30 VCI 100 50 94-106 Average  Perceptual Reasoning 21 PRI 82 12 77-89 Low Average  Working Memory 18 WMI 95 37 89-102 Average  Processing Speed 14 PSI 84 14 77-94 Low Average  Full Scale 83 FSIQ 88 21 84-92 Low Average  General Ability 51 GAI 91 27 86-96 Average    In order to objectively assess a wide range of cognitive domains in a highly standardized well normed and repeatable battery the patient was administered the Wechsler Adult Intelligence Scale-IV in its entirety.  This would allow repeat testing down the road.  As the patient is describing significant cognitive change over the past several years her current score should not be interpreted as reflexive of her lifelong functioning but as a description of her current status.  2 Global composite scores were calculated in this measure.  On the full scale IQ score composite score the patient produced a composite score of 88 which falls at the 21st percentile and is in the low average range relative to normative population.  While she did form a little bit better on the general abilities index score, which places less emphasis on items that are vulnerable to more acute changes such as attention and information processing speed,  The patient did not display any significant improvement with a generally consistent score.  This would suggest a number of cognitive domains that have changed rather than a singular domain.  Looking at individual composite scores/cognitive domains the patient performed in the average range relative to normative population on verbal based comprehension and knowledge based items which is roughly 1 standard  deviation below estimations of premorbid functioning.  The patient also did relatively well on measures of auditory encoding although somewhat below premorbid estimates.  The patient showed consistent and significant difficulties on visual-spatial/visual reasoning type items as well as information processing speed and focus execute capacities.          Verbal Comprehension Subtests Summary        Subtest Raw Score Scaled Score Percentile Rank Reference Group Scaled Score SEM  Similarities 21 9 37 8 1.12  Vocabulary 42 12 75 12 0.73  Information 12 9 37 9 0.73   The patient produced a verbal comprehension score of 100 which falls at the 50th percentile and is roughly 1 standard deviation below estimates of premorbid functioning.  There was some degree of scatter noted  but the patient demonstrated excellent retain knowledge of vocabulary type words and had some lower than predicted levels of performances on measures of verbal reasoning and general fund of information.  Perceptual Reasoning Subtests Summary        Subtest Raw Score Scaled Score Percentile Rank Reference Group Scaled Score SEM  Block Design 16 6 9 4  1.27  Matrix Reasoning 6 7 16 3  0.73  Visual Puzzles 8 8 25 6  0.99   In the global domain of perceptual reasoning and visual-spatial capacity the patient produced this perceptual reasoning index score of 82 which falls at the 12 percentile and is in the low average range relative to normative population.  This is significantly below predicted levels of premorbid functioning.  The patient did show consistency in individual subtest and performances were consistently below predicted levels of premorbid functioning.  The patient had difficulties with visual analysis and organization, visual reasoning and problem-solving and capacity to show fluid visual reasoning skills.  Working Comptroller Raw Score Scaled Score Percentile Rank Reference Group Scaled Score SEM   Digit Span 20 8 25 6  0.79  Arithmetic 12 10 50 9 0.95   The patient produced a working memory index score of 95 which falls at the 37th percentile and is in the average range relative to normative population.  All this is somewhat below predicted levels of premorbid functioning it is still her relatively highest level of functioning on this overall battery.  The patient showed some mild difficulties relative to premorbid estimates for her primary auditory encoding capacity but was able to actively process information or auditory Register.  This level of performance should not have an overly significant impact on her ability to store and organize new auditory information.  Processing Speed Subtests Summary        Subtest Raw Score Scaled Score Percentile Rank Reference Group Scaled Score SEM  Symbol Search 17 8 25 4  1.12  Coding 24 6 9 2  1.12    The patient produced a processing speed index score of 84 which falls at the 14th percentile and is in the low average range relative to normative population.  This is approaching 2 standard deviations lower performance and premorbid estimates.  There was general consistency between the 2 subtest making up this index suggesting mild changes to moderate changes in her focus execute abilities and inability to perform visual scanning and visual searching types of challenges similar to her lifelong capacity.      WMS-IV:          Index Score Summary        Index Sum of Scaled Scores Index Score Percentile Rank 95% Confidence Interval Qualitative Descriptor  Auditory Memory (AMI) 15 62 1 58-70 Extremely Low  Visual Memory (VMI) 5 54 0.1 50-60 Extremely Low  Immediate Memory (IMI) 12 63 1 59-71 Extremely Low  Delayed Memory (DMI) 8 54 0.1 50-65 Extremely Low    In order to objectively assess a wide range of learning and memory capacities the patient was administered the Wechsler Memory Scale-IV in its entirety.  This also allows for repeat testing as it  is a highly standardized and structured assessment of a wide range of memory domains.  On the Wechsler Adult Intelligence Scale the patient showed general retention of her primary auditory encoding and ability to actively process information in her auditory Register.  This is in contrast to her performance on measures of visual  encoding as she had significant difficulties and much worse performances.  The difficulties with visual encoding and processing would have a greater impact on her ability to actively store and organize new visual information versus auditory information.  Breaking memory and learning capacities down between auditory versus visual the patient produced an auditory memory index score of 62 which falls at the 1st percentile and in the extremely low range relative to normative population.  This is significantly below estimates of premorbid functioning and even consistently and significantly below predictions based on her current global cognitive functioning.  It is also below the capacity suggested by her current auditory encoding abilities.  This would suggest significant difficulties with storage, organization and retrieval of newly learned auditory information.  The patient showed even greater deficits with regard to visual memory and learning capacities where she produced a visual memory index score of 54 falling at the 0.1 percentile and also in the extremely low range relative to a normative population.  This even greater difficulty with visual memory and learning versus auditory could be explained with her greater difficulties with regard to visual encoding capacity but it still represents significant difficulties with storage, organization of visual based information and the greater visual encoding deficits and significant visual spatial and visual constructional abilities are likely explaining the greater visual versus auditory memory deficits.  In any event, the patient shows deficits  with both auditory and visual memory.  Breaking memory functions down between immediate versus delayed the patient had significant difficulties initially storing and organizing new auditory and visual information and showed progressive loss even on the limited amount of information she had initially stored after a 20-minute delay.  The patient showed very little if any improvement under recognition/cued recall types of format suggesting that the primary deficit at least with regard to auditory information is her inability to store and organize new auditory information rather than attentional/encoding or retrieval types of deficits.  Her visual memory deficits are likely compounded by difficulties with visual encoding and visual-spatial and visual constructional deficits overlain difficulties with her ability to effectively store and organize new information.         Primary Subtest Scaled Score Summary       Subtest Domain Raw Score Scaled Score Percentile Rank  Logical Memory I AM 16 5 5   Logical Memory II AM 0 1 0.1  Verbal Paired Associates I AM 6 4 2   Verbal Paired Associates II AM 2 5 5   Visual Reproduction I VM 13 3 1   Visual Reproduction II VM 0 2 0.4  Symbol Span VWM 6 5 5           Auditory Memory Process Score Summary      Process Score Raw Score Scaled Score Percentile Rank Cumulative Percentage (Base Rate)  LM II Recognition 12 - - 3-9%  VPA II Recognition 17 - - 3-9%         Visual Memory Process Score Summary      Process Score Raw Score Scaled Score Percentile Rank Cumulative Percentage (Base Rate)  VR II Recognition 1 - - 3-9%      WCST:  Administered but not completed due to difficulties with frustration tolerance   (Incomplete) test results:    Trials Administered: 17 Total Correct: 5   Total Errors: 12 %Errors: 71%   Perseverative Responses: 1 %Pers Responses: 6%   Perseverative Errors: 1 %Pers Errors: 6%   Nonperseverative Errors: 11 %Nonpersev Errors:  65%   Conceptual Lvl  Responses: 4 %Conceptual Lvl Responses: 24%    Categories Completed: 0 Trials to 1st: 129 Failure to Maintain Set: 0    While interpretation of her performance on the [place]  card sorting test is complicated by the patient's frustration on this measure she clearly showed significant difficulties with cognitive flexibility and ability to problem solve in a novel challenging situation.  The patient required 129 trials to complete the first category which suggest significant difficulties with reasoning and problem-solving.  Impression/Diagnosis:   Overall, the results of the current neuropsychological evaluation assessing a wide range of cognitive domains and in-depth analysis of memory and learning showed consistency between the patient and her family's reports of progressing ongoing cognitive decline with greater issues for memory and learning and changes in expressive language functioning.  The patient is also showing significant difficulties with problem-solving and other cognitive functioning related to executive functioning.  The pattern of cognitive strengths and weaknesses and memory functions are consistent with those that would be typically seen with a major neurocognitive disorder that is progressive in nature and the pattern on objective neuropsychological test data and symptoms described by patient and family would be consistent with the development of an Alzheimer's type pathology.  The patient's MRI was not consistent with significant cerebral vascular disease and the patient did have an ATN profile consistent with the possibility of Alzheimer's type pathology.  Therefore, the patient would meet criterion and patterns consistent with a major neurocognitive disorder due to Alzheimer's disease without behavioral disturbance.  As far as recommendations, the patient is continuing to be followed by neurology and is continuing to take her prescription of donezepil/Aricept   without apparent side effects.  I will sit down with the patient and her family if she would like and go over the results of the current neuropsychological assessment and we will make sure this information is available to her treating neurologist.  There is an appointment for feedback set up for 12/18/2023 which we will go over with the patient any specific recommendations for her going forward.  Diagnosis:    Major neurocognitive disorder due to Alzheimer disease, without behavioral disturbance (HCC)  Memory loss   _____________________ Norleen Asa, Psy.D. Clinical Neuropsychologist

## 2023-12-18 ENCOUNTER — Encounter: Admitting: Psychology

## 2023-12-18 DIAGNOSIS — R413 Other amnesia: Secondary | ICD-10-CM

## 2023-12-18 DIAGNOSIS — F0280 Dementia in other diseases classified elsewhere without behavioral disturbance: Secondary | ICD-10-CM | POA: Diagnosis not present

## 2023-12-18 DIAGNOSIS — G309 Alzheimer's disease, unspecified: Secondary | ICD-10-CM | POA: Diagnosis not present

## 2023-12-31 ENCOUNTER — Encounter: Payer: Self-pay | Admitting: Psychology

## 2023-12-31 NOTE — Progress Notes (Signed)
 Neuropsychological Evaluation   Patient:  Michelle Case   DOB: 06-01-1950  MR Number: [id number]  Location: [facility] [address] [place], [address] [place] [address] Dept: [phone]  Start: 11 AM  End: 12 PM  Provider/Observer:     Norleen JONELLE Asa PsyD  Chief Complaint:      Chief Complaint  Patient presents with   Memory Loss   Other    Cognitive change   REASON FOR REFERRAL:  Neuropsychological evaluation requested by Dr. Gregg, neurologist, for diagnostic clarification of reported cognitive changes. This session was for feedback and to provide the final report.    INTERVAL HISTORY:  The patient reports continued forgetfulness, feeling that her memory is still progressing. She notes difficulty recalling new information, such as whether she has completed a recent task, but finds that older memories are more accessible. She reports she does not go out much alone. A family member present confirmed increased forgetfulness, providing an example of the patient asking for a task to be done minutes after it was already completed.    REVIEW OF MEDICAL INFORMATION:  A comprehensive review of available medical records was conducted. This included data accessible through the shared electronic health record system [facility], [place], [place], [place]) dating back to 2011.  - Neurology: Under the care of Dr. Gregg. Workup has included an MRI of the brain, blood work (ATN profile), and a neurological review.  - MRI Brain: No evidence of significant atrophy, stroke, or excessive small vessel disease. Good differentiation of white matter.  - ATN Profile: The ratio of Tau 40/42 was noted to be in a range associated with an increased statistical risk for Alzheimer's disease, though this is not a definitive diagnostic marker.  - Medications: Currently prescribed donepezil   (Aricept ) by Dr. Gregg. The dose was recently increased. The patient denies any significant side effects such as headaches, agitation, or sleep disruption attributable to the medication, though she does report pre-existing insomnia.  ASSESSMENT SUMMARY AND IMPRESSION:  This feedback session was held to discuss the results of the comprehensive neuropsychological evaluation. All three data sources--subjective reports from the patient and family, objective test performance, and review of available medical records--were integrated to formulate a diagnostic impression.  The patient's presentation is consistent with a major neurocognitive disorder. The pattern of deficits, particularly the pronounced difficulty with learning and recall of new information while other domains remain relatively preserved, is characteristic of an Alzheimer's disease process. The onset of symptoms occurred after age 12. The patient is a 73 year old female. There are no behavioral disturbances noted. Other potential causes for cognitive decline, such as vascular disease (stroke) or significant metabolic issues, have been reasonably ruled out by the neurological workup. MRI findings do not suggest significant atrophy or vascular pathology. There are no current signs (e.g., tremor) to suggest Lewy body disease or Parkinson's disease.  Diagnosis: Major Neurocognitive Disorder due to Alzheimer's Disease, Late Onset, Without Behavioral Disturbance. The current presentation is in the mild stage.   PLAN AND RECOMMENDATIONS:  1.  Provided a copy of the full 8-page neuropsychological report. Discussed how to access the report and other medical information via the MyChart patient portal for herself and her daughter (with permission).  2.  Explained the diagnosis of late-onset Alzheimer's-type major neurocognitive disorder. Emphasized that this is a clinical diagnosis based on the pattern of findings, as definitive diagnosis is only  possible post-mortem.  3.  Affirmed that the current medical management by Dr. Gregg, including the prescription of donepezil  (Aricept ), is appropriate and on target.  4.  Advised the patient to schedule a follow-up appointment with her neurologist's office (Dr. Gregg or one of his nurse practitioners, e.g., Lauraine Born or Harlene Bogaert) to review these findings and discuss ongoing management.  5.  Discussed non-pharmacological strategies to support cognitive function and slow progression, including the importance of good sleep hygiene. Recommended maximizing exposure to natural light at sunrise and sunset to help regulate the sleep-wake cycle.  6.  Strongly recommended advanced care planning. Advised ensuring legal and financial affairs are in order (e.g., wills, power of attorney).  7.  Encouraged exploring future care options, such as memory care facilities, to be prepared for potential future needs, even if they never become necessary.  8.  Advised patient and family to contact me if any significant new symptoms emerge, such as visual hallucinations, which might prompt a re-evaluation of the differential diagnosis (e.g., considering Lewy body disease). No further routine neuropsychological testing is indicated at this time unless there is an unexpected change in clinical course.  9.  The complete neuropsychological evaluation can be found in the patient's EMR dated 12/05/2023.   Diagnosis:    Major neurocognitive disorder due to Alzheimer disease, without behavioral disturbance (HCC)  Memory loss   _____________________ Norleen Asa, Psy.D. Clinical Neuropsychologist

## 2024-01-01 ENCOUNTER — Ambulatory Visit: Payer: Medicare Other | Admitting: Psychology
